# Patient Record
Sex: Female | Born: 1955 | Race: White | Hispanic: No | Smoking: Never smoker
Health system: Southern US, Community
[De-identification: ages and names within clinical notes are randomized; demographics above are authoritative.]

## PROBLEM LIST (undated history)

## (undated) DIAGNOSIS — R739 Hyperglycemia, unspecified: Secondary | ICD-10-CM

## (undated) DIAGNOSIS — M8580 Other specified disorders of bone density and structure, unspecified site: Secondary | ICD-10-CM

## (undated) DIAGNOSIS — E119 Type 2 diabetes mellitus without complications: Secondary | ICD-10-CM

## (undated) DIAGNOSIS — D044 Carcinoma in situ of skin of scalp and neck: Secondary | ICD-10-CM

## (undated) DIAGNOSIS — F419 Anxiety disorder, unspecified: Secondary | ICD-10-CM

## (undated) DIAGNOSIS — C4491 Basal cell carcinoma of skin, unspecified: Secondary | ICD-10-CM

## (undated) HISTORY — DX: Other specified disorders of bone density and structure, unspecified site: M85.80

## (undated) HISTORY — DX: Basal cell carcinoma of skin, unspecified: C44.91

## (undated) HISTORY — DX: Anxiety disorder, unspecified: F41.9

## (undated) HISTORY — DX: Carcinoma in situ of skin of scalp and neck: D04.4

## (undated) HISTORY — PX: SKIN CANCER EXCISION: SHX779

## (undated) HISTORY — PX: POLYPECTOMY: SHX149

## (undated) HISTORY — PX: WISDOM TOOTH EXTRACTION: SHX21

## (undated) HISTORY — DX: Hyperglycemia, unspecified: R73.9

## (undated) HISTORY — PX: DILATION AND CURETTAGE OF UTERUS: SHX78

---
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
## 2008-08-07 HISTORY — PX: FOOT SURGERY: SHX648

## 2015-01-12 LAB — HM HEPATITIS C SCREENING LAB: HM Hepatitis Screen: NEGATIVE

## 2015-01-12 LAB — HM PAP SMEAR: HM Pap smear: NEGATIVE

## 2016-05-30 ENCOUNTER — Encounter: Payer: Self-pay | Admitting: Family Medicine

## 2016-05-30 LAB — HM PAP SMEAR: HM Pap smear: NEGATIVE

## 2018-12-17 ENCOUNTER — Telehealth: Payer: Self-pay

## 2018-12-17 NOTE — Telephone Encounter (Signed)
Copied from [place] [phone]. Topic: Appointment Scheduling - Scheduling Inquiry for Clinic >> Dec 16, 2018  4:19 PM Rayann Heman wrote: Reason for CRM: pt would like to schedule new pt appt with Dr Aundra Dubin. Please advise

## 2018-12-17 NOTE — Telephone Encounter (Signed)
I will see patient --- but please be sure that she knows if she establishes with me, then I am her PCP  If she wants to see Dr Aundra Dubin, that is fine -- she will just need to wait longer for an appointment  She can come into clinic for new patient appt to see me as long as she wears mask, passes COVID screen and sanitizes hands when she gets here. We can draw lab work on her also if she comes into office.

## 2018-12-18 NOTE — Telephone Encounter (Signed)
Called pt an scheduled a new pt physical with you and she states she is having some gall bladder issues, I did explain that she will be screened again for covid upon arrival and to wear a mask, I also asked her to sanitize her hands before going into the room, pt understood.  Her appointment is with you tomorrow at 8:20 pm. She will be fasting and needs labs also.  Nina,cma

## 2018-12-18 NOTE — Telephone Encounter (Signed)
Can we confirm her insurance? Patient scheduled tomorrow for new patient appt  Thanks!  LG

## 2018-12-19 ENCOUNTER — Other Ambulatory Visit: Payer: Self-pay

## 2018-12-19 ENCOUNTER — Encounter: Payer: Self-pay | Admitting: Family Medicine

## 2018-12-19 ENCOUNTER — Ambulatory Visit (INDEPENDENT_AMBULATORY_CARE_PROVIDER_SITE_OTHER): Payer: PRIVATE HEALTH INSURANCE | Admitting: Family Medicine

## 2018-12-19 VITALS — BP 112/78 | HR 71 | Temp 98.6°F | Resp 16 | Ht 63.0 in | Wt 142.2 lb

## 2018-12-19 DIAGNOSIS — R1011 Right upper quadrant pain: Secondary | ICD-10-CM

## 2018-12-19 LAB — CBC WITH DIFFERENTIAL/PLATELET
Basophils Absolute: 0 10*3/uL (ref 0.0–0.1)
Basophils Relative: 0.8 % (ref 0.0–3.0)
Eosinophils Absolute: 0.1 10*3/uL (ref 0.0–0.7)
Eosinophils Relative: 1.1 % (ref 0.0–5.0)
HCT: 40.6 % (ref 36.0–46.0)
Hemoglobin: 14.1 g/dL (ref 12.0–15.0)
Lymphocytes Relative: 33 % (ref 12.0–46.0)
Lymphs Abs: 1.7 10*3/uL (ref 0.7–4.0)
MCHC: 34.7 g/dL (ref 30.0–36.0)
MCV: 89.1 fl (ref 78.0–100.0)
Monocytes Absolute: 0.4 10*3/uL (ref 0.1–1.0)
Monocytes Relative: 7.2 % (ref 3.0–12.0)
Neutro Abs: 3 10*3/uL (ref 1.4–7.7)
Neutrophils Relative %: 57.9 % (ref 43.0–77.0)
Platelets: 265 10*3/uL (ref 150.0–400.0)
RBC: 4.55 Mil/uL (ref 3.87–5.11)
RDW: 12.6 % (ref 11.5–15.5)
WBC: 5.3 10*3/uL (ref 4.0–10.5)

## 2018-12-19 LAB — BASIC METABOLIC PANEL
BUN: 11 mg/dL (ref 6–23)
CO2: 31 mEq/L (ref 19–32)
Calcium: 9.3 mg/dL (ref 8.4–10.5)
Chloride: 103 mEq/L (ref 96–112)
Creatinine, Ser: 0.76 mg/dL (ref 0.40–1.20)
GFR: 76.86 mL/min (ref >60.00)
Glucose, Bld: 84 mg/dL (ref 70–99)
Potassium: 4.2 mEq/L (ref 3.5–5.1)
Sodium: 140 mEq/L (ref 135–145)

## 2018-12-19 LAB — HEPATIC FUNCTION PANEL
ALT: 18 U/L (ref 0–35)
AST: 23 U/L (ref 0–37)
Albumin: 4.4 g/dL (ref 3.5–5.2)
Alkaline Phosphatase: 52 U/L (ref 39–117)
Bilirubin, Direct: 0.1 mg/dL (ref 0.0–0.3)
Total Bilirubin: 0.8 mg/dL (ref 0.2–1.2)
Total Protein: 6.7 g/dL (ref 6.0–8.3)

## 2018-12-19 LAB — LIPASE: Lipase: 96 U/L — ABNORMAL HIGH (ref 11.0–59.0)

## 2018-12-19 LAB — AMYLASE: Amylase: 53 U/L (ref 27–131)

## 2018-12-19 NOTE — Progress Notes (Signed)
Subjective:    Patient ID: Dawn Matthews, female    DOB: 12/08/1955, 63 y.o.   MRN: [id number]  HPI   Patient presents to clinic to establish with PCP and also to discuss right upper quadrant abdominal pain that she suspects could possibly be her gallbladder.  Patient currently takes no medications and has been told she was prediabetic in the past, but worked on diet and lost a little bit of weight to keep her blood sugars under control.  States that she first had an episode of right upper quadrant abdominal pain about 2 weeks ago after drinking some sangria.  Patient states she hardly ever drinks, may have 1 or 2 glasses of wine a couple of times a year.  Wonders if the sangria was too sweet, but after drinking it she had severe right upper quadrant pain that caused her to vomit.  After vomiting and the passage of a few hours she was feeling much better and back to her normal self.  About a week later, she began to have right upper quadrant pain began after eating a cheese sandwich.  States the pain in the right upper quadrant seem to go right through her all the way to her back.  She almost went to the emergency room due to this episode of pain, but chose not to due to concerns over exposure to COVID-19.  Again after a few hours past the pain resolved on its own.  No fever or chills.  Other than the one episode after drinking a sangria, has not had any more vomiting.  No diarrhea.  No shortness of breath or wheezing.  No chest pain or palpitations.  No urinary symptoms.  Past Medical History:  Diagnosis Date  . Diabetes mellitus without complication ([place])    Borderline   Social History   Tobacco Use  . Smoking status: Never Smoker  . Smokeless tobacco: Never Used  Substance Use Topics  . Alcohol use: Yes    Comment: ocassionally   Past Surgical History:  Procedure Laterality Date  . FOOT SURGERY  2010   Family History  Problem Relation Age of Onset  . Diabetes Mother   .  Diabetes Father      Review of Systems  Constitutional: Negative for chills, fatigue and fever.  HENT: Negative for congestion, ear pain, sinus pain and sore throat.   Eyes: Negative.   Respiratory: Negative for cough, shortness of breath and wheezing.   Cardiovascular: Negative for chest pain, palpitations and leg swelling.  Gastrointestinal: RUQ ABD pain episodes off and on. Negative for diarrhea, nausea and vomiting.  Genitourinary: Negative for dysuria, frequency and urgency.  Musculoskeletal: Negative for arthralgias and myalgias.  Skin: Negative for color change, pallor and rash.  Neurological: Negative for syncope, light-headedness and headaches.  Psychiatric/Behavioral: The patient is not nervous/anxious.    Objective:   Physical Exam Vitals signs and nursing note reviewed.  Constitutional:      General: She is not in acute distress.    Appearance: She is normal weight. She is not toxic-appearing.  HENT:     Head: Normocephalic and atraumatic.  Eyes:     General: No scleral icterus.    Extraocular Movements: Extraocular movements intact.     Conjunctiva/sclera: Conjunctivae normal.  Neck:     Musculoskeletal: Normal range of motion and neck supple. No neck rigidity.  Cardiovascular:     Rate and Rhythm: Normal rate.     Heart sounds: Normal heart sounds.  Pulmonary:  Effort: Pulmonary effort is normal. No respiratory distress.     Breath sounds: Normal breath sounds.  Abdominal:     General: Abdomen is flat. There is no distension.     Palpations: Abdomen is soft. There is no mass.     Tenderness: There is abdominal tenderness (RUQ tender with palpation). There is no right CVA tenderness, left CVA tenderness, guarding or rebound.     Hernia: No hernia is present.  Musculoskeletal:     Right lower leg: No edema.     Left lower leg: No edema.  Skin:    General: Skin is warm and dry.     Coloration: Skin is not jaundiced or pale.     Findings: No erythema.   Neurological:     Mental Status: She is alert and oriented to person, place, and time.     Gait: Gait normal.  Psychiatric:        Mood and Affect: Mood normal.        Behavior: Behavior normal.    Vitals:   12/19/18 0839  BP: 112/78  Pulse: 71  Resp: 16  Temp: 98.6 F (37 C)  SpO2: 96%      Assessment & Plan:   Right upper quadrant pain- due to location of pain could be suspicious for gallbladder disease.  We will do blood work to further investigate.  We will also get abdominal ultrasound.  Advised to avoid foods that are excessively greasy, fatty and spicy to see if it makes any difference in pain episodes.  Advised she will be contacted in regards to her lab results and information about ultrasound.  We will plan to have patient return to clinic for complete physical exam after work-up for abdominal pain issues.  She is aware she can call office anytime for issues or concerns.

## 2018-12-26 ENCOUNTER — Ambulatory Visit
Admission: RE | Admit: 2018-12-26 | Discharge: 2018-12-26 | Disposition: A | Discharge status: Home / Self Care | Payer: PRIVATE HEALTH INSURANCE | Source: Ambulatory Visit | Attending: Family Medicine | Admitting: Family Medicine

## 2018-12-26 ENCOUNTER — Other Ambulatory Visit: Payer: Self-pay

## 2018-12-26 DIAGNOSIS — R1011 Right upper quadrant pain: Secondary | ICD-10-CM | POA: Diagnosis not present

## 2019-05-21 ENCOUNTER — Encounter: Payer: PRIVATE HEALTH INSURANCE | Admitting: Family Medicine

## 2019-12-10 ENCOUNTER — Telehealth: Payer: Self-pay

## 2019-12-10 ENCOUNTER — Other Ambulatory Visit: Payer: Self-pay

## 2019-12-10 ENCOUNTER — Ambulatory Visit: Payer: 59 | Admitting: Family Medicine

## 2019-12-10 ENCOUNTER — Encounter: Payer: Self-pay | Admitting: Family Medicine

## 2019-12-10 VITALS — BP 124/78 | HR 66 | Temp 97.1°F | Resp 16 | Ht 63.0 in | Wt 142.5 lb

## 2019-12-10 DIAGNOSIS — R7303 Prediabetes: Secondary | ICD-10-CM | POA: Diagnosis not present

## 2019-12-10 DIAGNOSIS — Z1159 Encounter for screening for other viral diseases: Secondary | ICD-10-CM

## 2019-12-10 DIAGNOSIS — M8580 Other specified disorders of bone density and structure, unspecified site: Secondary | ICD-10-CM | POA: Diagnosis not present

## 2019-12-10 DIAGNOSIS — Z114 Encounter for screening for human immunodeficiency virus [HIV]: Secondary | ICD-10-CM

## 2019-12-10 DIAGNOSIS — R748 Abnormal levels of other serum enzymes: Secondary | ICD-10-CM | POA: Diagnosis not present

## 2019-12-10 DIAGNOSIS — R5382 Chronic fatigue, unspecified: Secondary | ICD-10-CM | POA: Diagnosis not present

## 2019-12-10 DIAGNOSIS — Z1211 Encounter for screening for malignant neoplasm of colon: Secondary | ICD-10-CM

## 2019-12-10 LAB — CBC
HCT: 40.4 % (ref 36.0–46.0)
Hemoglobin: 13.7 g/dL (ref 12.0–15.0)
MCHC: 34 g/dL (ref 30.0–36.0)
MCV: 89.4 fl (ref 78.0–100.0)
Platelets: 262 10*3/uL (ref 150.0–400.0)
RBC: 4.52 Mil/uL (ref 3.87–5.11)
RDW: 12.7 % (ref 11.5–15.5)
WBC: 5.3 10*3/uL (ref 4.0–10.5)

## 2019-12-10 LAB — LIPID PANEL
Cholesterol: 172 mg/dL (ref 0–200)
HDL: 65.9 mg/dL (ref >39.00)
LDL Cholesterol: 95 mg/dL (ref 0–99)
NonHDL: 105.87
Total CHOL/HDL Ratio: 3
Triglycerides: 54 mg/dL (ref 0.0–149.0)
VLDL: 10.8 mg/dL (ref 0.0–40.0)

## 2019-12-10 LAB — COMPREHENSIVE METABOLIC PANEL
ALT: 24 U/L (ref 0–35)
AST: 30 U/L (ref 0–37)
Albumin: 4.5 g/dL (ref 3.5–5.2)
Alkaline Phosphatase: 55 U/L (ref 39–117)
BUN: 12 mg/dL (ref 6–23)
CO2: 32 mEq/L (ref 19–32)
Calcium: 9.6 mg/dL (ref 8.4–10.5)
Chloride: 101 mEq/L (ref 96–112)
Creatinine, Ser: 0.78 mg/dL (ref 0.40–1.20)
GFR: 74.36 mL/min (ref >60.00)
Glucose, Bld: 94 mg/dL (ref 70–99)
Potassium: 4.5 mEq/L (ref 3.5–5.1)
Sodium: 138 mEq/L (ref 135–145)
Total Bilirubin: 0.7 mg/dL (ref 0.2–1.2)
Total Protein: 6.8 g/dL (ref 6.0–8.3)

## 2019-12-10 LAB — HEMOGLOBIN A1C: Hgb A1c MFr Bld: 5.7 % (ref 4.6–6.5)

## 2019-12-10 LAB — FERRITIN: Ferritin: 89.4 ng/mL (ref 10.0–291.0)

## 2019-12-10 LAB — VITAMIN D 25 HYDROXY (VIT D DEFICIENCY, FRACTURES): VITD: 52.33 ng/mL (ref 30.00–100.00)

## 2019-12-10 LAB — TSH: TSH: 1.39 u[IU]/mL (ref 0.35–4.50)

## 2019-12-10 LAB — LIPASE: Lipase: 24 U/L (ref 11.0–59.0)

## 2019-12-10 NOTE — Telephone Encounter (Signed)
Noted. Thank you. Will request these records along with others we discussed during office visit today

## 2019-12-10 NOTE — Telephone Encounter (Signed)
Patient contacted the office and states she has the information on her previous PCP. Her name was Dawn Mccoy, MD. The address is [place], [place], [place]. And telephone number is [phone]

## 2019-12-10 NOTE — Assessment & Plan Note (Signed)
Pt notes long hx of insomnia which is improving with better sleep hygiene. Symptoms also improved with activity. Etiology not entirely clear - will get some blood work - given lightheadedness wonder about iron/anemia. Also notes some SOB, however, normal lung exam and blood pressure today and no hx of wheezing. If blood work normal and symptoms persisting may consider cardiology/pulm referral for further work-up.

## 2019-12-10 NOTE — Assessment & Plan Note (Signed)
Notes hx and several family members. Last glucose 84 which is reassuring. Blood work today

## 2019-12-10 NOTE — Assessment & Plan Note (Signed)
Taking Vit D. Information provided for locations for repeat Dexa. Vit D level and Ca today

## 2019-12-10 NOTE — Progress Notes (Signed)
Subjective:     Dawn Matthews is a 64 y.o. female presenting for Transfer of Care (from [employer]) and Fatigue (for a long time)     HPI  #Fatigue - has been feeling a little better the last few weeks - has been eating better and taking Vit D and centrum silver - gets weak and dizzy occasionally - did have a dizzy spell which was positional the other day - will occasionally get symptoms w/o bending - dizziness is rare - sister has noted that she looks pale - every other day will feel tired - not do anything - but when she starts doing things she feels better with activity  Endorses some breathing difficulty - feeling out of breath - which is not worse with walking - would probably notice if running - may happen with going up and down the stairs  Endorses some anxiety symptoms  Insomnia - for several years - falls asleep and then wakes up at 2 am and cannot fall back asleep - for years has been drinking coffee at 3 am and reading the paper before falling back asleep - goes to bed around 10 pm and falls asleep fine - waking up 2-3 am - will drink coffee and be awake for 1-2 hours - will go back to sleep until 7 am - now she is reading a book until sleepy and time awake is shorter   Review of Systems  Constitutional: Negative for chills and fever.  HENT: Negative for rhinorrhea.   Eyes: Negative for itching.  Respiratory: Positive for chest tightness (was regular, but more rare now) and shortness of breath. Negative for wheezing.   Cardiovascular: Positive for palpitations. Negative for leg swelling.  Gastrointestinal: Negative for constipation, diarrhea, nausea and vomiting.  Musculoskeletal: Negative for arthralgias and myalgias.       Knees/ankles achy  Neurological: Positive for dizziness and light-headedness. Negative for headaches.     Social History   Tobacco Use  Smoking Status Never Smoker  Smokeless Tobacco Never Used        Objective:    BP  Readings from Last 3 Encounters:  12/10/19 124/78  12/19/18 112/78   Wt Readings from Last 3 Encounters:  12/10/19 142 lb 8 oz (64.6 kg)  12/19/18 142 lb 3.2 oz (64.5 kg)    BP 124/78   Pulse 66   Temp (!) 97.1 F (36.2 C)   Resp 16   Ht 5\' 3"  (1.6 m)   Wt 142 lb 8 oz (64.6 kg)   BMI 25.24 kg/m    Physical Exam Constitutional:      General: She is not in acute distress.    Appearance: She is well-developed. She is not diaphoretic.  HENT:     Right Ear: External ear normal.     Left Ear: External ear normal.     Nose: Nose normal.  Eyes:     Conjunctiva/sclera: Conjunctivae normal.  Neck:     Thyroid: No thyroid mass, thyromegaly or thyroid tenderness.  Cardiovascular:     Rate and Rhythm: Normal rate and regular rhythm.     Heart sounds: No murmur.  Pulmonary:     Effort: Pulmonary effort is normal. No respiratory distress.     Breath sounds: Normal breath sounds. No wheezing.  Musculoskeletal:     Cervical back: Neck supple.  Skin:    General: Skin is warm and dry.     Capillary Refill: Capillary refill takes less than 2 seconds.  Neurological:     Mental Status: She is alert. Mental status is at baseline.  Psychiatric:        Mood and Affect: Mood normal.        Behavior: Behavior normal.           Assessment & Plan:   Problem List Items Addressed This Visit      Musculoskeletal and Integument   Osteopenia    Taking Vit D. Information provided for locations for repeat Dexa. Vit D level and Ca today      Relevant Orders   Comprehensive metabolic panel     Other   Prediabetes    Notes hx and several family members. Last glucose 84 which is reassuring. Blood work today      Relevant Orders   Hemoglobin A1c   Chronic fatigue - Primary    Pt notes long hx of insomnia which is improving with better sleep hygiene. Symptoms also improved with activity. Etiology not entirely clear - will get some blood work - given lightheadedness wonder about  iron/anemia. Also notes some SOB, however, normal lung exam and blood pressure today and no hx of wheezing. If blood work normal and symptoms persisting may consider cardiology/pulm referral for further work-up.       Relevant Orders   Lipid panel   Ferritin   Vitamin D, 25-hydroxy   TSH   CBC    Other Visit Diagnoses    Elevated lipase       Relevant Orders   Lipase   Encounter for hepatitis C screening test for low risk patient       Relevant Orders   Hepatitis C antibody   Screening for HIV (human immunodeficiency virus)       Relevant Orders   HIV Antibody (routine testing w rflx)   Colon cancer screening       Relevant Orders   Fecal occult blood, imunochemical       Return in about 6 months (around 06/11/2020).  Lesleigh Noe, MD

## 2019-12-10 NOTE — Patient Instructions (Signed)
Please call the location of your choice from the menu below to schedule your Mammogram and/or Bone Density appointment.   ° °[place]  ° °1. [facility]                °      Phone:  [phone] °[address]                               °[place], [place] [address]                                                             °Services: Traditional and 3D Mammogram, Bone Density  ° °2. [facility]           °      Phone: [phone] °[address]                                                       °[place], [place] [address]    °Service: Bone Density ONLY  ° *this site does NOT perform mammograms ° °3. [facility]                       ° Phone:  [phone] °[address]                                  °[place], [place] [address]                                            °Services:  3D Mammogram and Bone Density  ° ° °[place] ° °1. [facility]   °Phone:  [phone]   °[address]                                                                            °[place], [place] [address]                                            °Services: 3D Mammogram and Bone Density ° °2. [facility] [place] ([facility])  °Phone:  [phone]   °[address]                        °[place], [place] [address]                                              °  Services:  3D Mammogram and Bone Density

## 2019-12-11 LAB — HIV ANTIBODY (ROUTINE TESTING W REFLEX): HIV 1&2 Ab, 4th Generation: NONREACTIVE

## 2019-12-11 LAB — HEPATITIS C ANTIBODY
Hepatitis C Ab: NONREACTIVE
SIGNAL TO CUT-OFF: 0.01 (ref <1.00)

## 2019-12-15 ENCOUNTER — Other Ambulatory Visit (INDEPENDENT_AMBULATORY_CARE_PROVIDER_SITE_OTHER): Payer: 59

## 2019-12-15 DIAGNOSIS — Z1211 Encounter for screening for malignant neoplasm of colon: Secondary | ICD-10-CM

## 2019-12-15 LAB — FECAL OCCULT BLOOD, IMMUNOCHEMICAL: Fecal Occult Bld: NEGATIVE

## 2020-01-28 ENCOUNTER — Ambulatory Visit: Payer: PRIVATE HEALTH INSURANCE | Admitting: Dermatology

## 2020-05-24 ENCOUNTER — Other Ambulatory Visit: Payer: Self-pay | Admitting: Family Medicine

## 2020-05-24 DIAGNOSIS — Z1231 Encounter for screening mammogram for malignant neoplasm of breast: Secondary | ICD-10-CM

## 2020-06-14 ENCOUNTER — Ambulatory Visit (INDEPENDENT_AMBULATORY_CARE_PROVIDER_SITE_OTHER): Payer: 59 | Admitting: Family Medicine

## 2020-06-14 ENCOUNTER — Other Ambulatory Visit: Payer: Self-pay

## 2020-06-14 VITALS — BP 110/70 | HR 67 | Temp 98.2°F | Ht 63.0 in | Wt 143.8 lb

## 2020-06-14 DIAGNOSIS — Z0000 Encounter for general adult medical examination without abnormal findings: Secondary | ICD-10-CM

## 2020-06-14 DIAGNOSIS — C4491 Basal cell carcinoma of skin, unspecified: Secondary | ICD-10-CM | POA: Insufficient documentation

## 2020-06-14 DIAGNOSIS — Z23 Encounter for immunization: Secondary | ICD-10-CM | POA: Diagnosis not present

## 2020-06-14 DIAGNOSIS — C44319 Basal cell carcinoma of skin of other parts of face: Secondary | ICD-10-CM

## 2020-06-14 NOTE — Patient Instructions (Addendum)
Check with insurance about shingles shot -- and schedule nurse visit or go to pharmacy to get this done   Preventive Care 51-64 Years Old, Female Preventive care refers to visits with your health care provider and lifestyle choices that can promote health and wellness. This includes:  A yearly physical exam. This may also be called an annual well check.  Regular dental visits and eye exams.  Immunizations.  Screening for certain conditions.  Healthy lifestyle choices, such as eating a healthy diet, getting regular exercise, not using drugs or products that contain nicotine and tobacco, and limiting alcohol use. What can I expect for my preventive care visit? Physical exam Your health care provider will check your:  Height and weight. This may be used to calculate body mass index (BMI), which tells if you are at a healthy weight.  Heart rate and blood pressure.  Skin for abnormal spots. Counseling Your health care provider may ask you questions about your:  Alcohol, tobacco, and drug use.  Emotional well-being.  Home and relationship well-being.  Sexual activity.  Eating habits.  Work and work Statistician.  Method of birth control.  Menstrual cycle.  Pregnancy history. What immunizations do I need?  Influenza (flu) vaccine  This is recommended every year. Tetanus, diphtheria, and pertussis (Tdap) vaccine  You may need a Td booster every 10 years. Varicella (chickenpox) vaccine  You may need this if you have not been vaccinated. Zoster (shingles) vaccine  You may need this after age 67. Measles, mumps, and rubella (MMR) vaccine  You may need at least one dose of MMR if you were born in 1957 or later. You may also need a second dose. Pneumococcal conjugate (PCV13) vaccine  You may need this if you have certain conditions and were not previously vaccinated. Pneumococcal polysaccharide (PPSV23) vaccine  You may need one or two doses if you smoke cigarettes  or if you have certain conditions. Meningococcal conjugate (MenACWY) vaccine  You may need this if you have certain conditions. Hepatitis A vaccine  You may need this if you have certain conditions or if you travel or work in places where you may be exposed to hepatitis A. Hepatitis B vaccine  You may need this if you have certain conditions or if you travel or work in places where you may be exposed to hepatitis B. Haemophilus influenzae type b (Hib) vaccine  You may need this if you have certain conditions. Human papillomavirus (HPV) vaccine  If recommended by your health care provider, you may need three doses over 6 months. You may receive vaccines as individual doses or as more than one vaccine together in one shot (combination vaccines). Talk with your health care provider about the risks and benefits of combination vaccines. What tests do I need? Blood tests  Lipid and cholesterol levels. These may be checked every 5 years, or more frequently if you are over 60 years old.  Hepatitis C test.  Hepatitis B test. Screening  Lung cancer screening. You may have this screening every year starting at age 29 if you have a 30-pack-year history of smoking and currently smoke or have quit within the past 15 years.  Colorectal cancer screening. All adults should have this screening starting at age 34 and continuing until age 80. Your health care provider may recommend screening at age 39 if you are at increased risk. You will have tests every 1-10 years, depending on your results and the type of screening test.  Diabetes screening. This  is done by checking your blood sugar (glucose) after you have not eaten for a while (fasting). You may have this done every 1-3 years.  Mammogram. This may be done every 1-2 years. Talk with your health care provider about when you should start having regular mammograms. This may depend on whether you have a family history of breast cancer.  BRCA-related  cancer screening. This may be done if you have a family history of breast, ovarian, tubal, or peritoneal cancers.  Pelvic exam and Pap test. This may be done every 3 years starting at age 48. Starting at age 89, this may be done every 5 years if you have a Pap test in combination with an HPV test. Other tests  Sexually transmitted disease (STD) testing.  Bone density scan. This is done to screen for osteoporosis. You may have this scan if you are at high risk for osteoporosis. Follow these instructions at home: Eating and drinking  Eat a diet that includes fresh fruits and vegetables, whole grains, lean protein, and low-fat dairy.  Take vitamin and mineral supplements as recommended by your health care provider.  Do not drink alcohol if: ? Your health care provider tells you not to drink. ? You are pregnant, may be pregnant, or are planning to become pregnant.  If you drink alcohol: ? Limit how much you have to 0-1 drink a day. ? Be aware of how much alcohol is in your drink. In the [place] one drink equals one 12 oz bottle of beer (355 mL), one 5 oz glass of wine (148 mL), or one 1 oz glass of hard liquor (44 mL). Lifestyle  Take daily care of your teeth and gums.  Stay active. Exercise for at least 30 minutes on 5 or more days each week.  Do not use any products that contain nicotine or tobacco, such as cigarettes, e-cigarettes, and chewing tobacco. If you need help quitting, ask your health care provider.  If you are sexually active, practice safe sex. Use a condom or other form of birth control (contraception) in order to prevent pregnancy and STIs (sexually transmitted infections).  If told by your health care provider, take low-dose aspirin daily starting at age 51. What's next?  Visit your health care provider once a year for a well check visit.  Ask your health care provider how often you should have your eyes and teeth checked.  Stay up to date on all vaccines. This  information is not intended to replace advice given to you by your health care provider. Make sure you discuss any questions you have with your health care provider. Document Revised: 04/04/2018 Document Reviewed: 04/04/2018 Elsevier Patient Education  2020 [employer].

## 2020-06-14 NOTE — Progress Notes (Addendum)
Annual Exam   Chief Complaint:  Chief Complaint  Patient presents with  . Annual Exam    no major concerns     History of Present Illness:  Ms. Dawn Matthews is a 64 y.o. No obstetric history on file. who LMP was No LMP recorded. Patient is postmenopausal., presents today for her annual examination.     Nutrition She does get adequate calcium and Vitamin D in her diet. Diet: varies - tries to be healthy, but eats sweets Exercise: walking, not as active recently  Safety The patient wears seatbelts: yes.     The patient feels safe at home and in their relationships: yes.   Menstrual:  No issues  GYN She is not sexually active.    Cervical Cancer Screening (21-65):   Last Pap:   October 2017 Results were: no abnormalities HPV DNA - not done  Breast Cancer Screening (Age 49-74):  There is no FH of breast cancer. There is no FH of ovarian cancer. BRCA screening Not Indicated.  Last Mammogram: 2018 The patient does want a mammogram this year.    Colon Cancer Screening:  Age 58-75 yo - benefits outweigh the risk. Adults 65-85 yo who have never been screened benefit.  Benefits: 134000 people in 2016 will be diagnosed and 49,000 will die - early detection helps Harms: Complications 2/2 to colonoscopy High Risk (Colonoscopy): genetic disorder (Lynch syndrome or familial adenomatous polyposis), personal hx of IBD, previous adenomatous polyp, or previous colorectal cancer, FamHx start 10 years before the age at diagnosis, increased in males and black race  Options:  FIT - looks for hemoglobin (blood in the stool) - specific and fairly sensitive - must be done annually Cologuard - looks for DNA and blood - more sensitive - therefore can have more false positives, every 3 years Colonoscopy - every 10 years if normal - sedation, bowl prep, must have someone drive you  Shared decision making and the patient had decided to do FOBT and UTD.   Social History   Tobacco Use   Smoking Status Never Smoker  Smokeless Tobacco Never Used    Lung Cancer Screening (Ages 24-58): not applicable    Weight Wt Readings from Last 3 Encounters:  06/14/20 143 lb 12 oz (65.2 kg)  12/10/19 142 lb 8 oz (64.6 kg)  12/19/18 142 lb 3.2 oz (64.5 kg)   Patient has normal BMI  BMI Readings from Last 1 Encounters:  06/14/20 25.46 kg/m     Chronic disease screening Blood pressure monitoring:  BP Readings from Last 3 Encounters:  06/14/20 110/70  12/10/19 124/78  12/19/18 112/78    Lipid Monitoring: Indication for screening: age >30, obesity, diabetes, family hx, CV risk factors.  Lipid screening: Yes  Lab Results  Component Value Date   CHOL 172 12/10/2019   HDL 65.90 12/10/2019   LDLCALC 95 12/10/2019   TRIG 54.0 12/10/2019   CHOLHDL 3 12/10/2019     Diabetes Screening: age >13, overweight, family hx, PCOS, hx of gestational diabetes, at risk ethnicity Diabetes Screening screening: Yes  Lab Results  Component Value Date   HGBA1C 5.7 12/10/2019     Past Medical History:  Diagnosis Date  . Basal cell carcinoma   . Hyperglycemia   . Osteopenia   . 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 FOOT SURGERY  2010  . SKIN CANCER EXCISION      Prior to Admission  medications   Medication Sig Start Date End Date Taking? Authorizing Provider  Multiple Vitamins-Minerals (CENTRUM SILVER PO) Take by mouth.   Yes [provider]  VITAMIN D, CHOLECALCIFEROL, PO Take by mouth.   Yes [provider]    Allergies  Allergen Reactions  . Codeine     Gynecologic History: No LMP recorded. Patient is postmenopausal.  Obstetric History: No obstetric history on file.  Social History   Socioeconomic History  . Marital status: Single    Spouse name: Not on file  . Number of children: 0  . Years of education: College  . Highest education level: Not on file  Occupational  History  . Not on file  Tobacco Use  . Smoking status: Never Smoker  . Smokeless tobacco: Never Used  Vaping Use  . Vaping Use: Never used  Substance and Sexual Activity  . Alcohol use: Yes    Comment: ocassionally-wine  . Drug use: Never  . Sexual activity: Not Currently  Other Topics Concern  . Not on file  Social History Narrative   12/10/19   From: [place] - moved to [facility] in 2019 to be near family   Living: alone    Pet: dog - german shepherd   Work: retired from [employer] and [employer]      Family: sister nearby, but does have family in [place] still      Enjoys: walking, visiting small towns, crossword puzzles, drink coffee      Exercise: walking 3-4 miles per day   Diet: likes ice cream eater, trying to eat healthier now, usually skips lunch and eats early dinner      Safety   Seat belts: Yes    Guns: No   Safe in relationships: Yes    Social Determinants of Radio broadcast assistant Strain:   . Difficulty of Paying Living Expenses: Not on file  Food Insecurity:   . Worried About Charity fundraiser in the Last Year: Not on file  . Ran Out of Food in the Last Year: Not on file  Transportation Needs:   . Lack of Transportation (Medical): Not on file  . Lack of Transportation (Non-Medical): Not on file  Physical Activity:   . Days of Exercise per Week: Not on file  . Minutes of Exercise per Session: Not on file  Stress:   . Feeling of Stress : Not on file  Social Connections:   . Frequency of Communication with Friends and Family: Not on file  . Frequency of Social Gatherings with Friends and Family: Not on file  . Attends Religious Services: Not on file  . Active Member of Clubs or Organizations: Not on file  . Attends Archivist Meetings: Not on file  . Marital Status: Not on file  Intimate Partner Violence:   . Fear of Current or Ex-Partner: Not on file  . Emotionally Abused: Not on file  . Physically Abused: Not on file  .  Sexually Abused: Not on file    Family History  Problem Relation Age of Onset  . Diabetes Mother   . COPD Mother   . Hypertension Mother   . Atrial fibrillation Mother   . Diabetes Father   . Melanoma Sister   . Hypertension Sister   . Melanoma Brother   . Lung cancer Sister        smoker  . Diabetes Maternal Grandmother   . Breast cancer Neg Hx  Review of Systems  Constitutional: Negative for chills and fever.  HENT: Negative for congestion and sore throat.   Eyes: Negative for blurred vision and double vision.  Respiratory: Negative for shortness of breath.   Cardiovascular: Negative for chest pain.  Gastrointestinal: Negative for heartburn, nausea and vomiting.  Genitourinary: Negative.   Musculoskeletal: Negative.  Negative for myalgias.  Skin: Negative for rash.  Neurological: Negative for dizziness and headaches.  Endo/Heme/Allergies: Does not bruise/bleed easily.  Psychiatric/Behavioral: Negative for depression. The patient is not nervous/anxious.      Physical Exam BP 110/70   Pulse 67   Temp 98.2 F (36.8 C) (Temporal)   Ht '5\' 3"'  (1.6 m)   Wt 143 lb 12 oz (65.2 kg)   SpO2 96%   BMI 25.46 kg/m    BP Readings from Last 3 Encounters:  06/14/20 110/70  12/10/19 124/78  12/19/18 112/78      Physical Exam Exam conducted with a chaperone present.  Constitutional:      General: She is not in acute distress.    Appearance: She is well-developed. She is not diaphoretic.  HENT:     Head: Normocephalic and atraumatic.     Right Ear: External ear normal.     Left Ear: External ear normal.     Nose: Nose normal.  Eyes:     General: No scleral icterus.    Conjunctiva/sclera: Conjunctivae normal.  Cardiovascular:     Rate and Rhythm: Normal rate and regular rhythm.     Heart sounds: No murmur heard.   Pulmonary:     Effort: Pulmonary effort is normal. No respiratory distress.     Breath sounds: Normal breath sounds. No wheezing.  Abdominal:      General: Bowel sounds are normal. There is no distension.     Palpations: Abdomen is soft. There is no mass.     Tenderness: There is no abdominal tenderness. There is no guarding or rebound.  Genitourinary:    Comments: Pt with discomfort with speculum exam and unable to find cervix on first attempt. Pt elected to postpone to a future visit.  Musculoskeletal:        General: Normal range of motion.     Cervical back: Neck supple.  Lymphadenopathy:     Cervical: No cervical adenopathy.  Skin:    General: Skin is warm and dry.     Capillary Refill: Capillary refill takes less than 2 seconds.  Neurological:     Mental Status: She is alert and oriented to person, place, and time.     Deep Tendon Reflexes: Reflexes normal.  Psychiatric:        Behavior: Behavior normal.     Results:  PHQ-9:    Office Visit from 06/14/2020 in [place] [facility] [place]  PHQ-9 Total Score 13        Office Visit from 06/14/2020 in [place] [facility]  PHQ-9 Total Score 13        Assessment: 64 y.o. No obstetric history on file. female here for routine annual physical examination.  Plan: Problem List Items Addressed This Visit      Musculoskeletal and Integument   Basal cell carcinoma    Other Visit Diagnoses    Annual physical exam    -  Primary   Need for Tdap vaccination       Relevant Orders   Tdap vaccine greater than or equal to 7yo IM (Completed)      Screening: -- Blood  pressure screen normal -- cholesterol screening: not due for screening -- Weight screening: normal -- Diabetes Screening: not due for screening -- Nutrition: Encouraged healthy diet  The 10-year ASCVD risk score Mikey Bussing DC Jr., et al., 2013) is: 3.2%   Values used to calculate the score:     Age: 89 years     Sex: Female     Is Non-Hispanic African American: No     Diabetic: No     Tobacco smoker: No     Systolic Blood Pressure: 022 mmHg     Is BP treated: No     HDL Cholesterol:  65.9 mg/dL     Total Cholesterol: 172 mg/dL  -- Statin therapy for Age 25-75 with CVD risk >7.5%  Psych -- Depression screening (PHQ-9):    Office Visit from 06/14/2020 in [place] at [place]  PHQ-9 Total Score 13    increased - discussed and she feels this circumstantial and she is managing well. She will f/u if worsening   Safety -- tobacco screening: not using -- alcohol screening:  low-risk usage. -- no evidence of domestic violence or intimate partner violence.   Cancer Screening -- pap smear: Deferred due to difficulty with exam.  -- family history of breast cancer screening: done. not at high risk. -- Mammogram - ordered -- Colon cancer (age 69+)-- up to date  Immunizations Immunization History  Administered Date(s) Administered  . PFIZER SARS-COV-2 Vaccination 10/16/2019, 11/06/2019  . Tdap 06/14/2020    -- flu vaccine up to date -- TDAP q10 years given today -- Shingles (age >52) she will check with insurance -- Covid-19 Vaccine up to date   Encouraged healthy diet and exercise. Encouraged regular vision and dental care.    Lesleigh Noe, MD

## 2020-06-24 ENCOUNTER — Ambulatory Visit
Admission: RE | Admit: 2020-06-24 | Discharge: 2020-06-24 | Disposition: A | Discharge status: Home / Self Care | Payer: 59 | Source: Ambulatory Visit | Attending: Family Medicine | Admitting: Family Medicine

## 2020-06-24 ENCOUNTER — Other Ambulatory Visit: Payer: Self-pay

## 2020-06-24 DIAGNOSIS — Z1231 Encounter for screening mammogram for malignant neoplasm of breast: Secondary | ICD-10-CM | POA: Diagnosis not present

## 2020-07-09 ENCOUNTER — Telehealth: Payer: Self-pay

## 2020-07-09 NOTE — Telephone Encounter (Signed)
Patient calls in to report concerns for her OV with Dr. Einar Pheasant on 06/14/20.  She was calm and pleasant but felt she needed to make [place] aware of the following:  1.  She repeatedly stated and scheduled this appointment to be a physical and she should not have been billed for a copay.  Patient states she was asked at the window to pay a copay because it was scheduled as a follow up.  She did not pay and the check in person said they could just bill her if Dr. Einar Pheasant does not bill it as a CPE.  Dr. Einar Pheasant told patient during visit that she could do a CPE as long as patient did not have any other complaints, patient agreed.    Her concern with this is, the bill she has looks like she was billed for a "preventative exam" and charged a copay.  I told her I would investigate this but it I believe "preventative exam" is the level of service code used for physicals and Dr. Einar Pheasant does have documented as an annual exam.  She requests I let her know what I find out.   2.  Patient said she was told by Dr. Einar Pheasant that the Tetanus would be "covered under the physical" but it looks like she was being billed for the cost of the Tetanus as well.  I will investigate and follow up with her on this charge.   3.  Patient said she wanted an EKG but Dr. Einar Pheasant stated that she did not typically do EKG's at physicals.   4. Patient wanted documented that Dr. Einar Pheasant could not find her cervix and that the pap was painful ( which I did confirm that Dr. Einar Pheasant had documented in her OV note).  Patient goes on to report that , " This was the worst experience of my life and I have been having paps for nearly 50 years".  She reported that it was extremely painful and she had never had someone unable to find her cervix.  She states that she will not be coming back to see Dr. Einar Pheasant.   I apologized to patient that she had a negative experience and let her know that I will review this with the provider and with billing and call her back with follow up.   Patient thanked me and stated that overall the office was extremely nice and friendly and no other complaints.  I told her it would likely be later next week before I could follow up and she verbalized understanding.

## 2020-07-12 NOTE — Telephone Encounter (Signed)
Reviewed patient complaint.  1. I have updated the patient encounter to include the diagnosis code "Annual Physical exam." It was left off for the initial encounter - hopefully this will resolve the billing issue. The billing code is correct but it was lacking the appropriate diagnosis.   2. Agree with looking into the Tetanus charge.   3. I do not recall her requesting an EKG, but this is not a routine part of physicals that I do. I order them based on patient history and complaints.   4. It was a difficult exam and due to her discomfort we discussed stopping - I offered a GYN referral however, she stated she would return next year. If she would like to see GYN I can place that referral.   I am sorry for the billing issue that has occurred and for her negative experience.

## 2020-07-21 NOTE — Telephone Encounter (Signed)
Spoke with billing department.  We are refilling the claim with the correct dx code which should take care of the charges on her statement.   I LM on machine for patient to call me back to discuss further.

## 2020-07-22 NOTE — Telephone Encounter (Signed)
Patient called and I relayed message to her. Transferred call to Mandy's vm. EM

## 2020-09-13 NOTE — Telephone Encounter (Signed)
Per my office supervisor, when following up on claim, appears this has been corrected and paid.  Nothing further to do at this point.

## 2021-01-19 DIAGNOSIS — M79671 Pain in right foot: Secondary | ICD-10-CM | POA: Diagnosis not present

## 2021-01-19 DIAGNOSIS — M7671 Peroneal tendinitis, right leg: Secondary | ICD-10-CM | POA: Diagnosis not present

## 2021-06-27 ENCOUNTER — Telehealth: Payer: Self-pay

## 2021-06-27 NOTE — Telephone Encounter (Signed)
Spoke with patient myself and advised her that her symptoms are very concerning and I am unable to rule out an acute MI or other etiology.  I have advised her to go immediately to the emergency room and/or call 911 however patient declines multiple times.  I have informed her that this could be life-threatening.  Patient is aware and states that she still will not go to the emergency room.

## 2021-06-27 NOTE — Telephone Encounter (Signed)
Called patient she was super nice and understanding and appreciative that we are checking on her, [name] Np, took over call and will notate.

## 2021-06-27 NOTE — Telephone Encounter (Signed)
Per appt notes pt already has appt with Hoy Morn NP on 06/28/21 at 9:40.  I spoke with pt; pt said she just came in from a "little walk" and pt having tightness in chest. Pt has heaviness and tightness in entire chest that goes thru to pts back. This is not a sharpe or dull pain it is just uncomfortable feeling in chest and back. Last pain in rt side of chest and shoulder has been rated a pain level of 10 and the last shoulder type pain was 06/05/21. Pt said usually has SOB upon exertion but recently the SOB seems worse than usual. Pt having nausea on and off which is unusual for pt. Pt had cold symptoms for approx 10 days with runny nose but pt is not having runny nose any longer; no fever or body aches. Pt had a negative covid test since she has had cold symptoms but could not pin point the day that took test.Pt said over the past weekend pt was helping pick up sticks and limbs on [facility] walking trail for 5 hours. Pt wonders if pulled muscle doing that. Pt does not have a way to ck BP at this time. Pt said she does not want to go to UC or ED to be checked out for above symptoms today. Pt said she will keep appt already scheduled at [facility] office on 06/28/21 at 9 :22 with Tabitha Dugal NP.UC & ED precautions given and pt voiced understanding. Sending note to [name] NP and [facility] CMA. Will also teams [place] as well.

## 2021-06-27 NOTE — Telephone Encounter (Signed)
[facility] - Client TELEPHONE ADVICE RECORD [facility] Patient Name: Dawn Matthews Gender: Female DOB: [birth year] Age: 65 Y 61 M 12 D Return Phone Number: [phone] (Primary) Address: City/ State/ Zip: [place] [address] Client [facility] - Client Client Site [place] - Day Provider Waunita Schooner- MD Contact Type Call Who Is Calling Patient / Member / Family / Caregiver Call Type Triage / Clinical Relationship To Patient Self Return Phone Number [phone] (Primary) Chief Complaint CHEST PAIN - pain, pressure, heaviness or tightness Reason for Call Symptomatic / Request for [place] states she is having headaches, chest pain that radiates to her back and up to her shoulder. Translation No Nurse Assessment Nurse: Malena Peer, RN, Edwena Felty Date/Time (Eastern Time): 06/27/2021 1:07:47 PM Confirm and document reason for call. If symptomatic, describe symptoms. ---Caller states she has been having "heaviness" in her chest for the past few days; she states she has been doing a lot of lifting. Chest pain goes from under her breastbone and radiates around then up into her back. She states it isn't a real "pain" but she feels the sensation and this it may be from picking up trash in the community 2 days ago. She states if she had to rate the pain it would be maybe 1/10. She states she has also had a pain under her right breastbone that occurs when she reaches up with her right arm and states there is a piercing, lengthy pain that has been occurring for quite a while when she reaches "weird". She states she has to "rub the area out" to get it to stop hurting. She rates the pain at 10/10 the last time it occurred 06/05/21. Does the patient have any new or worsening symptoms? ---Yes Will a triage be completed? ---Yes Related visit to physician within the  last 2 weeks? ---No Does the PT have any chronic conditions? (i.e. diabetes, asthma, this includes High risk factors for pregnancy, etc.) ---No Is this a behavioral health or substance abuse call? ---No PLEASE NOTE: All timestamps contained within this report are represented as [place] Standard Time. CONFIDENTIALTY NOTICE: This fax transmission is intended only for the addressee. It contains information that is legally privileged, confidential or otherwise protected from use or disclosure. If you are not the intended recipient, you are strictly prohibited from reviewing, disclosing, copying using or disseminating any of this information or taking any action in reliance on or regarding this information. If you have received this fax in error, please notify us immediately by telephone so that we can arrange for its return to [place]. Phone: [phone], Toll-Free: [phone], Fax: [phone] Page: 2 of 3 Call Id: [id number] Guidelines Guideline Title Affirmed Question Affirmed Notes Nurse Date/Time Eilene Ghazi Time) Chest Injury - Bending Lifting or Twisting [1] High-risk adult (e.g., age > 51 years, osteoporosis, chronic steroid use) AND [2] still hurts Malena Peer, RN, Edwena Felty 06/27/2021 1:16:55 PM Disp. Time Eilene Ghazi Time) Disposition Final User 06/27/2021 1:05:32 PM Send to Urgent Queue Susette Racer 06/27/2021 1:23:00 PM See PCP within 24 Hours Yes Malena Peer, RN, Osie Cheeks Disagree/Comply Comply Caller Understands Yes PreDisposition Call Doctor Care Advice Given Per Guideline SEE PCP WITHIN 24 HOURS: * IF OFFICE WILL BE OPEN: You need to be examined within the next 24 hours. Call your doctor (or NP/PA) when the office opens and make an appointment. PAIN MEDICINES: * For pain relief, you can take either acetaminophen, ibuprofen, or naproxen. *  They are over-the-counter (OTC) pain drugs. You can buy them at the drugstore. * ACETAMINOPHEN - REGULAR STRENGTH TYLENOL: Take 650 mg (two  325 mg pills) by mouth every 4 to 6 hours as needed. Each Regular Strength Tylenol pill has 325 mg of acetaminophen. The most you should take is 10 pills a day (3,250 mg total). Note: In [place], the maximum is 12 pills a day (3,900 mg total). * ACETAMINOPHEN - EXTRA STRENGTH TYLENOL: Take 1,000 mg (two 500 mg pills) every 6 to 8 hours as needed. Each Extra Strength Tylenol pill has 500 mg of acetaminophen. The most you should take is 6 pills a day (3,000 mg total). Note: In [place], the maximum is 8 pills a day (4,000 mg total). * IBUPROFEN (E.G., MOTRIN, ADVIL): Take 400 mg (two 200 mg pills) by mouth every 6 hours. The most you should take is 6 pills a day (1,200 mg total). * NAPROXEN (E.G., ALEVE): Take 220 mg (one 220 mg pill) by mouth every 8 to 12 hours as needed. You may take 440 mg (two 220 mg pills) for your first dose. The most you should take is 3 pills a day (660 mg total). Note: In [place], the maximum is 2 pills a day (one every 12 hours; 440 mg total). * Use the lowest amount of medicine that makes your pain better. USE A COLD PACK FOR PAIN, SWELLING, OR BRUISING: * Put a cold pack or an ice bag (wrapped in a moist towel) on the area for 20 minutes. * Repeat in 1 hour, then every 4 hours while awake. * Continue this for the first 48 hours (2 days) after an injury. * This will help decrease pain, swelling, and bruising. * Caution: Avoid frostbite. USE HEAT ON AREA AFTER 48 HOURS: * If pain, swelling, or bruising last more than 48 hours (2 days), then use heat on the area. * Use a heat pack, heating pad, or warm wet washcloth. * Do this for 10 minutes three times a day. * This will help increase blood flow and improve healing. * Caution: Avoid burn. Do not sleep on a heating pad. * Severe pain persists over 2 hours after pain medicine and ice CALL BACK IF: * Difficulty breathing occurs * You become worse CARE ADVICE given per Chest Injury - Bending, Lifting, or Twisting  (Adult) guideline. Comments User: Rodney Cruise, RN Date/Time Eilene Ghazi Time): 06/27/2021 1:16:07 PM Caller denies having current chest pain or heaviness. She states she has always had shortness of breath but it is more so since getting over a cold recently

## 2021-06-28 ENCOUNTER — Ambulatory Visit (INDEPENDENT_AMBULATORY_CARE_PROVIDER_SITE_OTHER): Payer: Medicare Other | Admitting: Family

## 2021-06-28 ENCOUNTER — Ambulatory Visit
Admission: RE | Admit: 2021-06-28 | Discharge: 2021-06-28 | Disposition: A | Discharge status: Home / Self Care | Payer: Medicare Other | Source: Ambulatory Visit | Attending: Family | Admitting: Family

## 2021-06-28 ENCOUNTER — Encounter: Payer: Self-pay | Admitting: Family

## 2021-06-28 ENCOUNTER — Other Ambulatory Visit: Payer: Self-pay

## 2021-06-28 ENCOUNTER — Ambulatory Visit (INDEPENDENT_AMBULATORY_CARE_PROVIDER_SITE_OTHER): Payer: Medicare Other

## 2021-06-28 VITALS — BP 122/80 | HR 74 | Temp 97.5°F | Ht 63.0 in | Wt 143.0 lb

## 2021-06-28 DIAGNOSIS — R0602 Shortness of breath: Secondary | ICD-10-CM

## 2021-06-28 DIAGNOSIS — J449 Chronic obstructive pulmonary disease, unspecified: Secondary | ICD-10-CM | POA: Diagnosis not present

## 2021-06-28 DIAGNOSIS — R1011 Right upper quadrant pain: Secondary | ICD-10-CM | POA: Diagnosis not present

## 2021-06-28 DIAGNOSIS — R9431 Abnormal electrocardiogram [ECG] [EKG]: Secondary | ICD-10-CM | POA: Diagnosis not present

## 2021-06-28 DIAGNOSIS — Z1211 Encounter for screening for malignant neoplasm of colon: Secondary | ICD-10-CM | POA: Diagnosis not present

## 2021-06-28 DIAGNOSIS — K219 Gastro-esophageal reflux disease without esophagitis: Secondary | ICD-10-CM

## 2021-06-28 DIAGNOSIS — R93422 Abnormal radiologic findings on diagnostic imaging of left kidney: Secondary | ICD-10-CM

## 2021-06-28 LAB — COMPREHENSIVE METABOLIC PANEL
ALT: 18 U/L (ref 0–35)
AST: 25 U/L (ref 0–37)
Albumin: 4.3 g/dL (ref 3.5–5.2)
Alkaline Phosphatase: 54 U/L (ref 39–117)
BUN: 17 mg/dL (ref 6–23)
CO2: 30 mEq/L (ref 19–32)
Calcium: 9.4 mg/dL (ref 8.4–10.5)
Chloride: 101 mEq/L (ref 96–112)
Creatinine, Ser: 0.78 mg/dL (ref 0.40–1.20)
GFR: 79.64 mL/min (ref >60.00)
Glucose, Bld: 87 mg/dL (ref 70–99)
Potassium: 4.3 mEq/L (ref 3.5–5.1)
Sodium: 137 mEq/L (ref 135–145)
Total Bilirubin: 0.6 mg/dL (ref 0.2–1.2)
Total Protein: 6.8 g/dL (ref 6.0–8.3)

## 2021-06-28 LAB — CBC WITH DIFFERENTIAL/PLATELET
Basophils Absolute: 0.1 10*3/uL (ref 0.0–0.1)
Basophils Relative: 0.7 % (ref 0.0–3.0)
Eosinophils Absolute: 0 10*3/uL (ref 0.0–0.7)
Eosinophils Relative: 0.6 % (ref 0.0–5.0)
HCT: 40.7 % (ref 36.0–46.0)
Hemoglobin: 13.6 g/dL (ref 12.0–15.0)
Lymphocytes Relative: 25.1 % (ref 12.0–46.0)
Lymphs Abs: 1.8 10*3/uL (ref 0.7–4.0)
MCHC: 33.5 g/dL (ref 30.0–36.0)
MCV: 89.3 fl (ref 78.0–100.0)
Monocytes Absolute: 0.4 10*3/uL (ref 0.1–1.0)
Monocytes Relative: 5.6 % (ref 3.0–12.0)
Neutro Abs: 5 10*3/uL (ref 1.4–7.7)
Neutrophils Relative %: 68 % (ref 43.0–77.0)
Platelets: 279 10*3/uL (ref 150.0–400.0)
RBC: 4.56 Mil/uL (ref 3.87–5.11)
RDW: 12.7 % (ref 11.5–15.5)
WBC: 7.3 10*3/uL (ref 4.0–10.5)

## 2021-06-28 LAB — AMYLASE: Amylase: 40 U/L (ref 27–131)

## 2021-06-28 NOTE — Progress Notes (Signed)
Subjective:     Patient ID: Dawn Matthews, female    DOB: 1956/07/16, 65 y.o.   MRN: [id number]  Chief Complaint  Patient presents with   Abdominal Pain   Chest Pain    Chest tightness     Abdominal Pain Associated symptoms include nausea. Pertinent negatives include no constipation, diarrhea, fever, frequency or vomiting.  Chest Pain  Associated symptoms include abdominal pain (ruq and epigastric abdominal tenderness with radiation to the back), nausea and shortness of breath (a bit worse in the last two days even with rest, not at current). Pertinent negatives include no cough, fever, palpitations or vomiting.  Patient is in today for two day h/o a band of pain that starts in the right middle abdominal upper area that radiates around to her back side. Denies chest pain. Still with some sob even with resting as well,  states she has had this for some time but worse in the last few days. She did have a cold about two weeks ago and there is still some residual sob since then.   This am she is having a tenderness that is difficult to describe in her bil lower to upper abdomen, possibly some nausea 1/10 on the pain scale. Described as uncomfortable feeling that waxes and wanes. This morning and last night was worse, but right now not as back. She did lift heavy branches four days ago which she thinks may have contributed to maybe muscle exhaustion.   Denies constipation and or diarrhea. Denies vomiting. Denies any known heartburn but does feel tightness in epigastric area.  Oct 30th she went to move her right arm upwards and had an excruciating pain under her right breast bone which has since resolved but it was pretty significant.   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     Past Surgical History:  Procedure Laterality Date   FOOT SURGERY  2010   SKIN CANCER EXCISION      Family History  Problem Relation Age of Onset   Diabetes Mother    COPD Mother    Hypertension Mother    Atrial fibrillation Mother    Diabetes Father    Melanoma Sister    Hypertension Sister    Melanoma Brother    Lung cancer Sister        smoker   Diabetes Maternal Grandmother    Breast cancer Neg Hx     Social History   Socioeconomic History   Marital status: Single    Spouse name: Not on file   Number of children: 0   Years of education: College   Highest education level: Not on file  Occupational History   Not on file  Tobacco Use   Smoking status: Never   Smokeless tobacco: Never  Vaping Use   Vaping Use: Never used  Substance and Sexual Activity   Alcohol use: Yes    Comment: ocassionally-wine   Drug use: Never  Sexual activity: Not Currently  Other Topics Concern   Not on file  Social History Narrative   12/10/19   From: [place] - moved to [facility] in 2019 to be near family   Living: alone    Pet: dog - german shepherd   Work: retired from [employer] and [employer]      Family: sister nearby, but does have family in [place] still      Enjoys: walking, visiting small towns, crossword puzzles, drink coffee      Exercise: walking 3-4 miles per day   Diet: likes ice cream eater, trying to eat healthier now, usually skips lunch and eats early dinner      Safety   Seat belts: Yes    Guns: No   Safe in relationships: Yes    Social Determinants of Radio broadcast assistant Strain: Not on file  Food Insecurity: Not on file  Transportation Needs: Not on file  Physical Activity: Not on file  Stress:  Not on file  Social Connections: Not on file  Intimate Partner Violence: Not on file    Outpatient Medications Prior to Visit  Medication Sig Dispense Refill   Multiple Vitamins-Minerals (CENTRUM SILVER PO) Take by mouth.     VITAMIN D, CHOLECALCIFEROL, PO Take by mouth.     No facility-administered medications prior to visit.    Allergies  Allergen Reactions   Penicillins Rash   Codeine     Review of Systems  Constitutional:  Negative for chills and fever.  Respiratory:  Positive for shortness of breath (a bit worse in the last two days even with rest, not at current). Negative for cough and wheezing.   Cardiovascular:  Negative for chest pain, palpitations and leg swelling.  Gastrointestinal:  Positive for abdominal pain (ruq and epigastric abdominal tenderness with radiation to the back) and nausea. Negative for blood in stool, constipation, diarrhea, heartburn and vomiting.  Genitourinary:  Negative for flank pain, frequency and urgency.      Objective:    Physical Exam Abdominal:     General: Abdomen is flat. Bowel sounds are normal. There is no distension.     Palpations: Abdomen is soft.     Tenderness: There is abdominal tenderness in the right upper quadrant. There is no rebound. Positive signs include Murphy's sign.     Hernia: No hernia is present.  Musculoskeletal:     Lumbar back: No swelling, edema or tenderness.    BP 122/80   Pulse 74   Temp (!) 97.5 F (36.4 C) (Temporal)   Ht 5\' 3"  (1.6 m)   Wt 143 lb (64.9 kg)   SpO2 97%   BMI 25.33 kg/m  Wt Readings from Last 3 Encounters:  06/28/21 143 lb (64.9 kg)  06/14/20 143 lb 12 oz (65.2 kg)  12/10/19 142 lb 8 oz (64.6 kg)       Assessment & Plan:   Problem List Items Addressed This Visit       Other   Abnormal finding on EKG   Relevant Orders   Ambulatory referral to Cardiology   Abdominal pain, right upper quadrant - Primary   Relevant Orders   US Abdomen Complete   CBC with  Differential/Platelet   Comprehensive metabolic panel   Amylase   Shortness of breath   Relevant Orders   DG Chest 2 View   EKG 12-Lead   Screening for malignant neoplasm of colon   Relevant Orders  Cologuard    I am having Dawn Matthews "Dawn Matthews" maintain her Multiple Vitamins-Minerals (CENTRUM SILVER PO) and (VITAMIN D, CHOLECALCIFEROL, PO).  No orders of the defined types were placed in this encounter.

## 2021-06-28 NOTE — Assessment & Plan Note (Signed)
Stat chest x-ray ordered pending results patient informed if any increasing shortness of breath to go immediately to the emergency room.

## 2021-06-28 NOTE — Assessment & Plan Note (Signed)
Slightly abnormal EKG with suggested left atrial enlargement.  Patient asymptomatic at current.  Referral placed for cardiologist.  Patient advised

## 2021-06-28 NOTE — Patient Instructions (Signed)
We are ordering a stat ultrasound to rule out gallbladder cystitis.  If there is any worsening right upper quadrant abdominal tenderness and/or increased nausea or pain please go to the emergency room immediately.  As you are experiencing some shortness of breath we have also ordered a stat chest x-ray pending results.  If it anytime your shortness of breath gets any worse please do not hesitate to go to the emergency room and/or call 911  We have also ordered some labs please stop at lab on your way out the door and we will notify you of the results when they come in.  We did perform an EKG today which showed a slightly abnormal finding.  This is not an emergency but I do suggest that you follow-up with cardiology for further evaluation.  If you do experience any chest pain and/or palpitations with sudden onset please got o ER and or call 911.  It was a pleasure seeing you today! Please do not hesitate to reach out with any questions and or concerns.  Regards,   Dawn Matthews

## 2021-06-28 NOTE — Assessment & Plan Note (Signed)
Positive Murphy sign on exam patient symptomatic.  Advised she should likely go to the emergency room patient declines, prefers imaging.  Ordered ultrasound of the abdomen stat.  Pending results.  Lab work also ordered pending results.  Advised patient if any worsening abdominal pain and/or pain in general patient to go immediately to the emergency room.

## 2021-06-28 NOTE — Assessment & Plan Note (Signed)
Cologuard ordered as patient refuses colonoscopy.

## 2021-06-29 ENCOUNTER — Telehealth: Payer: Self-pay | Admitting: Family

## 2021-06-29 MED ORDER — OMEPRAZOLE 20 MG PO CPDR: 20.0000 mg | DELAYED_RELEASE_CAPSULE | Freq: Every day | ORAL | 3 refills | Status: DC

## 2021-06-29 MED ORDER — ALBUTEROL SULFATE HFA 108 (90 BASE) MCG/ACT IN AERS: 2.0000 | INHALATION_SPRAY | RESPIRATORY_TRACT | 0 refills | Status: DC | PRN

## 2021-06-29 MED ORDER — METHYLPREDNISOLONE 4 MG PO TBPK: ORAL_TABLET | ORAL | 0 refills | Status: DC

## 2021-06-29 NOTE — Telephone Encounter (Signed)
Called and spoke with patient we have changed plan of care.  Patient has verbalized understanding and acknowledgment of the new change in treatment plan.  For the suggestion of COPD on the chest x-ray I am going to refer patient to pulmonary [facility].  Patient should expect a call within the next week or so.  Patient advised to call if no call is received.  I would prefer that patient has pulmonary function test/spirometry to confirm the diagnosis prior to prescribing daily maintenance inhaler.  For now we will send a Medrol Dosepak as well as albuterol as necessary for bronchospasms patient.  Both prescription sent to preferred pharmacy.  I have also still ordered the ultrasound of the kidneys which she should also receive a call from our referral department as well.  Patient advised to let me know if she does not receive this message.   Patient also recommended follow-up in the next 2 to 3 weeks patient states that she already made a follow-up appointment with me for December 6.  We will follow-up and touch base on how patient is feeling with the addition of these medications as she may be experiencing a COPD exacerbation.  Patient advised to go to the emergency room if she has any increasing shortness of breath and/or chest pain.

## 2021-07-05 NOTE — Telephone Encounter (Signed)
Patient was called and informed me that she has gotten an appointment for her pulmonology but did not receive an appointment or call about [place] for her kidney's patient was informed if she does not hear from anyone about this tomorrow before noon to let me know so I can call and figure out what is going on with her referral. Patient stated understanding. Patient informed me me that she is feeling better on the steroid. Patient also stated she keeps inhaler near by but does not take it. Patient stated appreciation for the call.

## 2021-07-05 NOTE — Telephone Encounter (Signed)
Pt called checking on status of what Pt and Tabitha discussed.

## 2021-07-06 NOTE — Telephone Encounter (Signed)
Pt called stating that she still haven't heard anything regarding the ultra sound. Pt would like a call back.

## 2021-07-07 NOTE — Telephone Encounter (Signed)
Patient called and was given the number to schedule her [place], don't know why they did not call and schedule her when the order was seen. Patient stated she will call them and schedule right away.

## 2021-07-11 ENCOUNTER — Other Ambulatory Visit: Payer: Self-pay

## 2021-07-11 ENCOUNTER — Ambulatory Visit
Admission: RE | Admit: 2021-07-11 | Discharge: 2021-07-11 | Disposition: A | Discharge status: Home / Self Care | Payer: Medicare Other | Source: Ambulatory Visit | Attending: Family | Admitting: Family

## 2021-07-11 DIAGNOSIS — R93422 Abnormal radiologic findings on diagnostic imaging of left kidney: Secondary | ICD-10-CM | POA: Diagnosis present

## 2021-07-11 DIAGNOSIS — R93421 Abnormal radiologic findings on diagnostic imaging of right kidney: Secondary | ICD-10-CM | POA: Insufficient documentation

## 2021-07-12 ENCOUNTER — Other Ambulatory Visit: Payer: Self-pay

## 2021-07-12 ENCOUNTER — Ambulatory Visit (INDEPENDENT_AMBULATORY_CARE_PROVIDER_SITE_OTHER): Payer: Medicare Other | Admitting: Family

## 2021-07-12 ENCOUNTER — Encounter: Payer: Self-pay | Admitting: Family

## 2021-07-12 DIAGNOSIS — R9431 Abnormal electrocardiogram [ECG] [EKG]: Secondary | ICD-10-CM

## 2021-07-12 DIAGNOSIS — R0602 Shortness of breath: Secondary | ICD-10-CM

## 2021-07-12 NOTE — Patient Instructions (Signed)
F/u with cardiologist as well as pulmonary as scheduled.   It was a pleasure seeing you today! Please do not hesitate to reach out with any questions and or concerns.  Regards,   [name]

## 2021-07-12 NOTE — Assessment & Plan Note (Signed)
Completed steroid pack and has albuterol on hand if needed. Has appt with pulmonary for workup, pending appt.

## 2021-07-12 NOTE — Assessment & Plan Note (Addendum)
Pt to f/u with cardiologist , to call and make appt.   Time spent with pt in office discussing plan of care and alternative possible options totaled approximately 21 minutes.

## 2021-07-12 NOTE — Progress Notes (Addendum)
Established Patient Office Visit  Subjective:  Patient ID: Dawn Matthews, female    DOB: 1956-08-04  Age: 65 y.o. MRN: [id number]  CC: No chief complaint on file.    HPI Dawn Matthews is here today for two week f/u.  Was experiencing sob, and completed medrol dose pack and feeling much better. No longer with shortness of breath and or epigastric tightening. She is with no acute concerns today.  U/s renal yesterday 07/11/21, negative for acute findings. Per radiologist no evidence of CKD.  U/s abdomen: simple 1.5 cm right lobe liver. No evidence cholecystitis , suggestion of increased right renal cortical echotexture (r/o by u/s renal 07/11/21)  XRAY chest 06/28/21: suggestion of COPD, no acute findings. Pt referred to pulmonology for COPD workup/spirometry/PFTs, has appt 08/09/20.   EKG: pt to make appt and schedule, she will call back cardiology.   Labs: CBC, CMP and amylase within normal limits 06/28/21   Past Medical History:  Diagnosis Date  . Basal cell carcinoma   . Hyperglycemia   . Osteopenia   . 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 FOOT SURGERY  2010  . SKIN CANCER EXCISION      Family History  Problem Relation Age of Onset  . Diabetes Mother   . COPD Mother   . Hypertension Mother   . Atrial fibrillation Mother   . Diabetes Father   . Melanoma Sister   . Hypertension Sister   . Melanoma Brother   . Lung cancer Sister        smoker  . Diabetes Maternal Grandmother   . Breast cancer Neg Hx     Social History   Socioeconomic History  . Marital status: Single    Spouse name: Not on file  . Number of children: 0  . Years of education: College  . Highest education level: Not on file  Occupational History  . Not on file  Tobacco Use  . Smoking status: Never  . Smokeless tobacco: Never  Vaping Use  . Vaping Use: Never used  Substance and Sexual Activity  . Alcohol use: Yes     Comment: ocassionally-wine  . Drug use: Never  . Sexual activity: Not Currently  Other Topics Concern  . Not on file  Social History Narrative   12/10/19   From: [place] - moved to [facility] in 2019 to be near family   Living: alone    Pet: dog - german shepherd   Work: retired from [employer] and [employer]      Family: sister nearby, but does have family in [place] still      Enjoys: walking, visiting small towns, crossword puzzles, drink coffee      Exercise: walking 3-4 miles per day   Diet: likes ice cream eater, trying to eat healthier now, usually skips lunch and eats early dinner      Safety   Seat belts: Yes    Guns: No   Safe in relationships: Yes    Social Determinants of Radio broadcast assistant Strain: Not on file  Food Insecurity: Not on file  Transportation Needs: Not on file  Physical Activity: Not on file  Stress: Not on file  Social Connections: Not on file  Intimate Partner Violence: Not on file    Outpatient Medications Prior to Visit  Medication Sig Dispense Refill  . Multiple Vitamins-Minerals (CENTRUM SILVER PO)  Take by mouth.    Marland Kitchen VITAMIN D, CHOLECALCIFEROL, PO Take by mouth.    Marland Kitchen albuterol (VENTOLIN HFA) 108 (90 Base) MCG/ACT inhaler Inhale 2 puffs into the lungs every 4 (four) hours as needed for shortness of breath. 1 each 0  . methylPREDNISolone (MEDROL DOSEPAK) 4 MG TBPK tablet Take per package instructions 21 tablet 0  . omeprazole (PRILOSEC) 20 MG capsule Take 1 capsule (20 mg total) by mouth daily. 30 capsule 3   No facility-administered medications prior to visit.    Allergies  Allergen Reactions  . Penicillins Rash  . Codeine     ROS Review of Systems  Review of Systems  Respiratory:  Negative for shortness of breath.   Cardiovascular:  Negative for chest pain and palpitations.  Gastrointestinal:  Negative for constipation and diarrhea.  Genitourinary:  Negative for dysuria, frequency and urgency.   Musculoskeletal:  Negative for myalgias.  Psychiatric/Behavioral:  Negative for depression and suicidal ideas.   All other systems reviewed and are negative.    Objective:    Physical Exam  Gen: NAD, resting comfortably  CV: RRR with no murmurs appreciated Pulm: NWOB, CTAB with no crackles, wheezes, or rhonchi Skin: warm, dry Psych: Normal affect and thought content  BP 116/78   Pulse (!) 57   Temp (!) 97.5 F (36.4 C) (Temporal)   Ht 5\' 3"  (1.6 m)   Wt 144 lb (65.3 kg)   SpO2 97%   BMI 25.51 kg/m  Wt Readings from Last 3 Encounters:  07/25/21 143 lb (64.9 kg)  07/22/21 144 lb (65.3 kg)  07/12/21 144 lb (65.3 kg)     Health Maintenance Due  Topic Date Due  . Pneumonia Vaccine 41+ Years old (1 - PCV) Never done  . Zoster Vaccines- Shingrix (1 of 2) Never done  . PAP SMEAR-Modifier  05/31/2019  . DEXA SCAN  Never done  . COLON CANCER SCREENING ANNUAL FOBT  12/14/2020  . COVID-19 Vaccine (5 - Booster for Pfizer series) 02/07/2021    There are no preventive care reminders to display for this patient.  Lab Results  Component Value Date   TSH 1.39 12/10/2019   Lab Results  Component Value Date   WBC 7.3 06/28/2021   HGB 13.6 06/28/2021   HCT 40.7 06/28/2021   MCV 89.3 06/28/2021   PLT 279.0 06/28/2021   Lab Results  Component Value Date   NA 137 06/28/2021   K 4.3 06/28/2021   CO2 30 06/28/2021   GLUCOSE 87 06/28/2021   BUN 17 06/28/2021   CREATININE 0.78 06/28/2021   BILITOT 0.6 06/28/2021   ALKPHOS 54 06/28/2021   AST 25 06/28/2021   ALT 18 06/28/2021   PROT 6.8 06/28/2021   ALBUMIN 4.3 06/28/2021   CALCIUM 9.4 06/28/2021   GFR 79.64 06/28/2021   Lab Results  Component Value Date   CHOL 172 12/10/2019   Lab Results  Component Value Date   HDL 65.90 12/10/2019   Lab Results  Component Value Date   LDLCALC 95 12/10/2019   Lab Results  Component Value Date   TRIG 54.0 12/10/2019   Lab Results  Component Value Date   CHOLHDL 3  12/10/2019   Lab Results  Component Value Date   HGBA1C 5.7 12/10/2019      Assessment & Plan:   Problem List Items Addressed This Visit       Other   Abnormal finding on EKG    Pt to f/u with cardiologist , to call  and make appt.   Time spent with pt in office discussing plan of care and alternative possible options totaled approximately 21 minutes.      Shortness of breath    Completed steroid pack and has albuterol on hand if needed. Has appt with pulmonary for workup, pending appt.        No orders of the defined types were placed in this encounter.    Follow-up: Return in about 3 months (around 10/10/2021) for regular f/u .    [name], FNP

## 2021-07-19 ENCOUNTER — Encounter: Payer: Self-pay | Admitting: Emergency Medicine

## 2021-07-19 ENCOUNTER — Other Ambulatory Visit: Payer: Self-pay

## 2021-07-19 ENCOUNTER — Ambulatory Visit
Admission: EM | Admit: 2021-07-19 | Discharge: 2021-07-19 | Disposition: A | Discharge status: Home / Self Care | Payer: Medicare Other | Attending: Emergency Medicine | Admitting: Emergency Medicine

## 2021-07-19 DIAGNOSIS — B349 Viral infection, unspecified: Secondary | ICD-10-CM

## 2021-07-19 LAB — POCT INFLUENZA A/B
Influenza A, POC: NEGATIVE
Influenza B, POC: NEGATIVE

## 2021-07-19 MED ORDER — BENZONATATE 100 MG PO CAPS: 100.0000 mg | ORAL_CAPSULE | Freq: Three times a day (TID) | ORAL | 0 refills | Status: DC | PRN

## 2021-07-19 NOTE — Discharge Instructions (Addendum)
Your flu test is negative.    Take the [facility] as needed for cough.  Take Tylenol as needed for fever or discomfort.   Follow up with your primary care provider if your symptoms are not improving.

## 2021-07-19 NOTE — ED Provider Notes (Signed)
Roderic Palau    CSN: [id number] Arrival date & time: 07/19/21  0809      History   Chief Complaint Chief Complaint  Patient presents with   Cough   Headache   Sore Throat   Generalized Body Aches    HPI Dawn Matthews is a 65 y.o. female.  Patient presents with 2-day history of headache, postnasal drip, sore throat, cough.  No fever, rash, shortness of breath, vomiting, diarrhea, or other symptoms.  Treatment at home with ibuprofen.  Patient has also been using albuterol inhaler as needed.  She was seen by her PCP on 06/28/2021; started on Medrol dose pack; labs, EKG, CXR, RUQ ultrasound, renal ultrasound done. CXR showed COPD; Lab work unremarkable.  She was seen again by her PCP on 07/12/2021 for a 2 week recheck; no additional treatment.  Her medical history includes prediabetes, osteopenia, chronic fatigue, basal cell carcinoma, squamous cell carcinoma.  The history is provided by the patient and medical records.   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      Home Medications    Prior to Admission medications   Medication Sig Start Date End Date Taking? Authorizing Provider  benzonatate (TESSALON) 100 MG capsule Take 1 capsule (100 mg total) by mouth 3 (three) times daily as needed for cough. 07/19/21  Yes Sharion Balloon, NP  albuterol (VENTOLIN HFA) 108 (90 Base) MCG/ACT inhaler Inhale 2 puffs into the lungs every 4 (four) hours as needed for shortness of breath.  06/29/21   [name], FNP  Multiple Vitamins-Minerals (CENTRUM SILVER PO) Take by mouth.    [provider]  omeprazole (PRILOSEC) 20 MG capsule Take 1 capsule (20 mg total) by mouth daily. 06/29/21   [name], FNP  VITAMIN D, CHOLECALCIFEROL, PO Take by mouth.    [provider]    Family History Family History  Problem Relation Age of Onset   Diabetes Mother    COPD Mother    Hypertension Mother    Atrial fibrillation Mother    Diabetes Father    Melanoma Sister    Hypertension Sister    Melanoma Brother    Lung cancer Sister        smoker   Diabetes Maternal Grandmother    Breast cancer Neg Hx     Social History Social History   Tobacco Use   Smoking status: Never   Smokeless tobacco: Never  Vaping Use   Vaping Use: Never used  Substance Use Topics   Alcohol use: Yes    Comment: ocassionally-wine   Drug use: Never     Allergies   Penicillins and Codeine   Review of Systems Review of Systems  Constitutional:  Negative for chills and fever.  HENT:  Positive for postnasal drip and sore throat. Negative for ear pain.   Respiratory:  Positive for cough. Negative for shortness of breath.  Cardiovascular:  Negative for chest pain and palpitations.  Gastrointestinal:  Negative for abdominal pain, diarrhea and vomiting.  Skin:  Negative for color change and rash.  Neurological:  Positive for headaches. Negative for dizziness.  All other systems reviewed and are negative.   Physical Exam Triage Vital Signs ED Triage Vitals  Enc Vitals Group     BP      Pulse      Resp      Temp      Temp src      SpO2      Weight      Height      Head Circumference      Peak Flow      Pain Score      Pain Loc      Pain Edu?      Excl. in [place]?    No data found.  Updated Vital Signs BP 123/83 (BP Location: Left Arm)   Pulse 83   Temp 98.8 F (37.1 C) (Oral)   Resp 18   SpO2 96%   Visual Acuity Right Eye Distance:   Left Eye  Distance:   Bilateral Distance:    Right Eye Near:   Left Eye Near:    Bilateral Near:     Physical Exam Vitals and nursing note reviewed.  Constitutional:      General: She is not in acute distress.    Appearance: She is well-developed.  HENT:     Right Ear: Tympanic membrane normal.     Left Ear: Tympanic membrane normal.     Nose: Nose normal.     Mouth/Throat:     Mouth: Mucous membranes are moist.     Pharynx: Oropharynx is clear.     Comments: Clear postnasal drip.  Cardiovascular:     Rate and Rhythm: Normal rate and regular rhythm.     Heart sounds: Normal heart sounds.  Pulmonary:     Effort: Pulmonary effort is normal. No respiratory distress.     Breath sounds: Normal breath sounds.  Abdominal:     Palpations: Abdomen is soft.     Tenderness: There is no abdominal tenderness.  Musculoskeletal:     Cervical back: Neck supple.  Skin:    General: Skin is warm and dry.  Neurological:     Mental Status: She is alert.  Psychiatric:        Mood and Affect: Mood normal.        Behavior: Behavior normal.     UC Treatments / Results  Labs (all labs ordered are listed, but only abnormal results are displayed) Labs Reviewed  POCT INFLUENZA A/B    EKG   Radiology No results found.  Procedures Procedures (including critical care time)  Medications Ordered in UC Medications - No data to display  Initial Impression / Assessment and Plan / UC Course  I have reviewed the triage vital signs and the nursing notes.  Pertinent labs & imaging results that were available during my care of the patient were reviewed by me and considered in my medical decision making (see chart for details).   Viral illness.  Rapid flu negative.  Patient declines COVID test.  She took 3 at home COVID test which were negative.  Treating cough with Tessalon Perles.  Instructed patient to take Tylenol as needed for fever or discomfort.  Instructed her to follow-up with her PCP if her  symptoms are not improving.  She agrees to plan of care.  Final Clinical Impressions(s) / UC Diagnoses   Final diagnoses:  Viral illness     Discharge Instructions      Your flu test is negative.    Take the [facility] as needed for cough.  Take Tylenol as needed for fever or discomfort.   Follow up with your primary care provider if your symptoms are not improving.        ED Prescriptions     Medication Sig Dispense Auth. Provider   benzonatate (TESSALON) 100 MG capsule Take 1 capsule (100 mg total) by mouth 3 (three) times daily as needed for cough. 21 capsule Sharion Balloon, NP      PDMP not reviewed this encounter.   Sharion Balloon, NP 07/19/21 [phone]

## 2021-07-19 NOTE — ED Triage Notes (Signed)
Pt here with slight cough, headache, sore throat and body aches x 2 days.

## 2021-07-22 ENCOUNTER — Other Ambulatory Visit: Payer: Self-pay

## 2021-07-22 ENCOUNTER — Encounter: Payer: Self-pay | Admitting: Family Medicine

## 2021-07-22 ENCOUNTER — Ambulatory Visit: Payer: Medicare Other | Admitting: Family Medicine

## 2021-07-22 DIAGNOSIS — R0602 Shortness of breath: Secondary | ICD-10-CM

## 2021-07-22 DIAGNOSIS — J069 Acute upper respiratory infection, unspecified: Secondary | ICD-10-CM | POA: Diagnosis not present

## 2021-07-22 MED ORDER — METHYLPREDNISOLONE 4 MG PO TBPK: ORAL_TABLET | ORAL | 0 refills | Status: DC

## 2021-07-22 NOTE — Patient Instructions (Addendum)
Tessalon for cough  Inhaler for cough and any tight/wheeze/short of breath  Take the prednisone as directed   Rest  Fluids   I think this is viral bronchitis   Watch for productive cough /fever worse shortness of breath  If severe - go to ER   Update if not starting to improve in a week or if worsening

## 2021-07-22 NOTE — Progress Notes (Signed)
Subjective:    Patient ID: Dawn Matthews, female    DOB: 1956-05-14, 65 y.o.   MRN: [id number]  This visit occurred during the SARS-CoV-2 public health emergency.  Safety protocols were in place, including screening questions prior to the visit, additional usage of staff PPE, and extensive cleaning of exam room while observing appropriate contact time as indicated for disinfecting solutions.   HPI 65 yo pt of Dr Einar Pheasant presents for f/u of UC visit for viral illness   Wt Readings from Last 3 Encounters:  07/22/21 144 lb (65.3 kg)  07/12/21 144 lb (65.3 kg)  06/28/21 143 lb (64.9 kg)   25.51 kg/m  At uc: 07/19/21 Dawn Matthews is a 65 y.o. female.  Patient presents with 2-day history of headache, postnasal drip, sore throat, cough.  No fever, rash, shortness of breath, vomiting, diarrhea, or other symptoms.  Treatment at home with ibuprofen.  Patient has also been using albuterol inhaler as needed.  She was seen by her PCP on 06/28/2021; started on Medrol dose pack; labs, EKG, CXR, RUQ ultrasound, renal ultrasound done. CXR showed COPD; Lab work unremarkable.  She was seen again by her PCP on 07/12/2021 for a 2 week recheck; no additional treatment.  Her medical history includes prediabetes, osteopenia, chronic fatigue, basal cell carcinoma, squamous cell carcinoma.  Has appt with pulmonary next month   Sister died of lung cancer  Grew up in a household of smoke   A/p:  Viral illness.  Rapid flu negative.  Patient declines COVID test.  She took 3 at home COVID test which were negative.  Treating cough with Tessalon Perles.  Instructed patient to take Tylenol as needed for fever or discomfort.  Instructed her to follow-up with her PCP if her symptoms are not improving.  She agrees to plan of care. Neg flu and covid testing  Used inhaler last night Dry cough   Last cxr DG Chest 2 View (Accession [id number]) (Order [id number]) Imaging Date: 06/28/2021 Department: [facility] Released By: Leeanne Rio, CMA Authorizing: [name], FNP   Exam Status  Status  Final [99]   PACS Intelerad Image Link   Show images for DG Chest 2 View  Study Result  Narrative & Impression  CLINICAL DATA:  Shortness of breath   EXAM: CHEST - 2 VIEW   COMPARISON:  None.   FINDINGS: Cardiac size is within normal limits. Low position of diaphragms suggests COPD. There are no signs of pulmonary edema or focal pulmonary consolidation. There is no pleural effusion or pneumothorax.   IMPRESSION: COPD. There are no signs of pulmonary edema or focal pulmonary consolidation.     Electronically Signed   By: Elmer Picker M.D.   On: 06/28/2021 10:49    She never needed the cough medicine   Last night more head congestion  Felt like she had trouble breathing (got panicky) She used the albuterol inhaler and it helped  Now cough is dry / a little rattling  Not wheezing  Not tight but breathing is labored   Nasal congestion is bad  Today a little better  Nasal mucous is not clear  Not a lot of sinus pain or pressure today  Ears feel funny /crackling  Throat was sore and now improved /no longer sore   Very hoarse   Otc: plain mucinex   Patient Active Problem List   Diagnosis Date Noted   Abnormal finding on EKG 06/28/2021   Shortness of  breath 06/28/2021   Basal cell carcinoma 06/14/2020   Prediabetes 12/10/2019   Osteopenia 12/10/2019   Chronic fatigue 12/10/2019   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    Past Surgical History:  Procedure Laterality Date   FOOT SURGERY  2010   SKIN CANCER EXCISION     Social History   Tobacco Use   Smoking status: Never   Smokeless tobacco: Never  Vaping Use   Vaping Use: Never used  Substance Use Topics   Alcohol use: Yes    Comment: ocassionally-wine    Drug use: Never   Family History  Problem Relation Age of Onset   Diabetes Mother    COPD Mother    Hypertension Mother    Atrial fibrillation Mother    Diabetes Father    Melanoma Sister    Hypertension Sister    Melanoma Brother    Lung cancer Sister        smoker   Diabetes Maternal Grandmother    Breast cancer Neg Hx    Allergies  Allergen Reactions   Penicillins Rash   Codeine    Current Outpatient Medications on File Prior to Visit  Medication Sig Dispense Refill   albuterol (VENTOLIN HFA) 108 (90 Base) MCG/ACT inhaler Inhale 2 puffs into the lungs every 4 (four) hours as needed for shortness of breath. 1 each 0   guaiFENesin (MUCINEX PO) Take 2 tablets by mouth 2 (two) times daily as needed.     Multiple Vitamins-Minerals (CENTRUM SILVER PO) Take by mouth.     omeprazole (PRILOSEC) 20 MG capsule Take 1 capsule (20 mg total) by mouth daily. 30 capsule 3   VITAMIN D, CHOLECALCIFEROL, PO Take by mouth.     benzonatate (TESSALON) 100 MG capsule Take 1 capsule (100 mg total) by mouth 3 (three) times daily as needed for cough. (Patient not taking: Reported on 07/22/2021) 21 capsule 0   No current facility-administered medications on file prior to visit.    Review of Systems  Constitutional:  Positive for fatigue. Negative for activity change, appetite change, fever and unexpected weight change.  HENT:  Positive for congestion, rhinorrhea and voice change. Negative for ear pain, sinus pressure and sore throat.   Eyes:  Negative for pain, redness and visual disturbance.  Respiratory:  Positive for cough and shortness of breath. Negative for wheezing.   Cardiovascular:  Negative for chest pain and palpitations.  Gastrointestinal:  Negative for abdominal pain, blood in stool, constipation and diarrhea.  Endocrine: Negative for polydipsia and polyuria.  Genitourinary:  Negative for dysuria, frequency and urgency.  Musculoskeletal:  Negative for arthralgias, back pain and  myalgias.  Skin:  Negative for pallor and rash.  Allergic/Immunologic: Negative for environmental allergies.  Neurological:  Positive for headaches. Negative for dizziness and syncope.  Hematological:  Negative for adenopathy. Does not bruise/bleed easily.  Psychiatric/Behavioral:  Negative for decreased concentration and dysphoric mood. The patient is not nervous/anxious.       Objective:   Physical Exam Constitutional:      General: She is not in acute distress.    Appearance: Normal appearance. She is well-developed and normal weight. She is not ill-appearing, toxic-appearing or diaphoretic.  HENT:     Head: Normocephalic and atraumatic.     Comments: Nares are injected and congested      Right Ear: Tympanic membrane, ear  canal and external ear normal.     Left Ear: Tympanic membrane, ear canal and external ear normal.     Ears:     Comments: Scant cerumen bilaterally     Nose: Congestion and rhinorrhea present.     Mouth/Throat:     Mouth: Mucous membranes are moist.     Pharynx: Oropharynx is clear. No oropharyngeal exudate or posterior oropharyngeal erythema.     Comments: Clear pnd  Eyes:     General:        Right eye: No discharge.        Left eye: No discharge.     Conjunctiva/sclera: Conjunctivae normal.     Pupils: Pupils are equal, round, and reactive to light.  Cardiovascular:     Rate and Rhythm: Normal rate.     Heart sounds: Normal heart sounds.  Pulmonary:     Effort: Pulmonary effort is normal. No respiratory distress.     Breath sounds: Normal breath sounds. No stridor. No wheezing, rhonchi or rales.     Comments: Good air exch  Some upper airway sounds  Chest:     Chest wall: No tenderness.  Musculoskeletal:     Cervical back: Normal range of motion and neck supple.  Lymphadenopathy:     Cervical: No cervical adenopathy.  Skin:    General: Skin is warm and dry.     Capillary Refill: Capillary refill takes less than 2 seconds.     Findings: No  erythema or rash.  Neurological:     Mental Status: She is alert.     Cranial Nerves: No cranial nerve deficit.  Psychiatric:        Mood and Affect: Mood normal.          Assessment & Plan:   Problem List Items Addressed This Visit       Respiratory   URI with cough and congestion    Suspect viral  Improved from last night  Reviewed notes from here and also UC in detail and imaging  Suspect she has had 2 viral illnesses in a row Mild bronchitis most likely  Px medrol dose pk after discussion of pot side eff  Reassuring exam Symptom care  ER precautions discussed         Other   Shortness of breath    With recent viral uri  Brief episode last night with nasal congestion  Reassuring /better today  Px medrol dose pack for acute bronchitis (in setting of poss copd)  She has pulmonary f/u planned Update if not starting to improve in a week or if worsening  ER precautions discussed      Relevant Medications   methylPREDNISolone (MEDROL DOSEPAK) 4 MG TBPK tablet

## 2021-07-22 NOTE — Assessment & Plan Note (Addendum)
Suspect viral  Improved from last night  Reviewed notes from here and also UC in detail and imaging  Suspect she has had 2 viral illnesses in a row Mild bronchitis most likely  Px medrol dose pk after discussion of pot side eff  Reassuring exam Symptom care  ER precautions discussed

## 2021-07-22 NOTE — Assessment & Plan Note (Addendum)
With recent viral uri  Brief episode last night with nasal congestion  Reassuring /better today  Px medrol dose pack for acute bronchitis (in setting of poss copd)  She has pulmonary f/u planned Update if not starting to improve in a week or if worsening  ER precautions discussed

## 2021-07-25 ENCOUNTER — Ambulatory Visit: Payer: Medicare Other | Admitting: Family

## 2021-07-25 ENCOUNTER — Encounter: Payer: Self-pay | Admitting: Family

## 2021-07-25 ENCOUNTER — Other Ambulatory Visit: Payer: Self-pay

## 2021-07-25 VITALS — BP 132/88 | HR 87 | Temp 98.1°F | Ht 63.0 in | Wt 143.0 lb

## 2021-07-25 DIAGNOSIS — J440 Chronic obstructive pulmonary disease with acute lower respiratory infection: Secondary | ICD-10-CM | POA: Diagnosis not present

## 2021-07-25 DIAGNOSIS — R0602 Shortness of breath: Secondary | ICD-10-CM

## 2021-07-25 DIAGNOSIS — J209 Acute bronchitis, unspecified: Secondary | ICD-10-CM | POA: Diagnosis not present

## 2021-07-25 MED ORDER — AZITHROMYCIN 250 MG PO TABS: ORAL_TABLET | ORAL | 0 refills | Status: AC

## 2021-07-25 MED ORDER — MONTELUKAST SODIUM 10 MG PO TABS: 10.0000 mg | ORAL_TABLET | Freq: Every day | ORAL | 2 refills | Status: DC

## 2021-07-25 MED ORDER — ALBUTEROL SULFATE HFA 108 (90 BASE) MCG/ACT IN AERS: 2.0000 | INHALATION_SPRAY | RESPIRATORY_TRACT | 0 refills | Status: DC | PRN

## 2021-07-25 MED ORDER — FLUTICASONE-SALMETEROL 250-50 MCG/ACT IN AEPB: 1.0000 | INHALATION_SPRAY | Freq: Two times a day (BID) | RESPIRATORY_TRACT | 1 refills | Status: DC

## 2021-07-25 NOTE — Progress Notes (Signed)
Established Patient Office Visit  Subjective:  Patient ID: Dawn Matthews, female    DOB: 07/22/1956  Age: 65 y.o. MRN: [id number]  CC:  Chief Complaint  Patient presents with   Cough    HAs been to see Dr Glori Bickers and several UC visits for cough. Dry cough.     HPI Dawn Matthews is here today for increasing concerns.  Saw Dr. Glori Bickers 07/22/21 was given rx for medrol dose pack as suspected viral etiology. However, over the weekend pt with increasing sob and cough spasms that made it hard to catch her breath. A little bit better today, but still a lot of cough. She feels ' I need an antiobitic) she does not have f/u with pulmonary until mid January. Yesterday coughing so much she felt like she needed to throw up. No fever and or chills. No ear pain. Mild sore scratchy throat. Some chest congestion. She tested at home this am, and covid negative.  Sx have now been for the last eight days, and not improving if anything getting worse.   She is using albuterol inhaler prn, did have to use it about five days ago, and then since taking about twice daily.   12/14 went to urgent care , flu test negative. Given tessalon perrles for cough. Still has two more days of steroids.  Last cxr 06/28/21 which did indicate possible COPD hence consult with pulmonary.  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     Past Surgical History:  Procedure Laterality Date   FOOT SURGERY  2010   SKIN CANCER EXCISION      Family History  Problem Relation Age of Onset   Diabetes Mother    COPD Mother    Hypertension Mother    Atrial fibrillation Mother    Diabetes Father    Melanoma Sister    Hypertension Sister    Melanoma Brother    Lung cancer Sister        smoker   Diabetes Maternal Grandmother    Breast cancer Neg Hx     Social History   Socioeconomic History   Marital status: Single     Spouse name: Not on file   Number of children: 0   Years of education: College   Highest education level: Not on file  Occupational History   Not on file  Tobacco Use   Smoking status: Never   Smokeless tobacco: Never  Vaping Use   Vaping Use: Never used  Substance and Sexual Activity   Alcohol use: Yes    Comment: ocassionally-wine   Drug use: Never   Sexual activity: Not Currently  Other Topics Concern   Not on file  Social History Narrative   12/10/19   From: [place] - moved to [place] in 2019 to be near family   Living: alone    Pet: dog - german shepherd   Work: retired from [employer] and [employer]      Family: sister nearby, but does have family in [place] still      Enjoys: walking, visiting small towns, crossword puzzles, drink coffee      Exercise: walking 3-4 miles per day   Diet: likes ice cream eater, trying to eat healthier now, usually skips lunch and eats early dinner      Safety   Seat belts: Yes  Guns: No   Safe in relationships: Yes    Social Determinants of Radio broadcast assistant Strain: Not on file  Food Insecurity: Not on file  Transportation Needs: Not on file  Physical Activity: Not on file  Stress: Not on file  Social Connections: Not on file  Intimate Partner Violence: Not on file    Outpatient Medications Prior to Visit  Medication Sig Dispense Refill   methylPREDNISolone (MEDROL DOSEPAK) 4 MG TBPK tablet Take per package instructions 21 tablet 0   Multiple Vitamins-Minerals (CENTRUM SILVER PO) Take by mouth.     VITAMIN D, CHOLECALCIFEROL, PO Take by mouth.     albuterol (VENTOLIN HFA) 108 (90 Base) MCG/ACT inhaler Inhale 2 puffs into the lungs every 4 (four) hours as needed for shortness of breath. 1 each 0   guaiFENesin (MUCINEX PO) Take 2 tablets by mouth 2 (two) times daily as needed. (Patient not taking: Reported on 07/25/2021)     benzonatate (TESSALON) 100 MG capsule Take 1 capsule (100 mg total) by mouth 3  (three) times daily as needed for cough. (Patient not taking: Reported on 07/22/2021) 21 capsule 0   omeprazole (PRILOSEC) 20 MG capsule Take 1 capsule (20 mg total) by mouth daily. 30 capsule 3   No facility-administered medications prior to visit.    Allergies  Allergen Reactions   Penicillins Rash   Codeine     ROS Review of Systems  Constitutional:  Negative for chills and fever.  HENT:  Positive for sore throat. Negative for congestion, ear pain and sinus pressure.   Respiratory:  Positive for cough (wet productive cough) and shortness of breath. Negative for wheezing. Stridor: albuterol inhaler twice daily.  Cardiovascular:  Negative for chest pain and palpitations.     Objective:    Physical Exam Constitutional:      General: She is not in acute distress.    Appearance: Normal appearance. She is not ill-appearing.  HENT:     Head: Normocephalic.     Right Ear: Tympanic membrane normal.     Left Ear: Tympanic membrane normal.     Nose: Nose normal. No congestion or rhinorrhea.     Mouth/Throat:     Mouth: Mucous membranes are moist.     Pharynx: No pharyngeal swelling, oropharyngeal exudate or posterior oropharyngeal erythema.     Tonsils: No tonsillar exudate.  Eyes:     Extraocular Movements: Extraocular movements intact.     Conjunctiva/sclera: Conjunctivae normal.     Pupils: Pupils are equal, round, and reactive to light.  Neck:     Thyroid: No thyroid mass.  Cardiovascular:     Rate and Rhythm: Normal rate and regular rhythm.  Pulmonary:     Effort: Pulmonary effort is normal.     Breath sounds: Normal breath sounds.  Lymphadenopathy:     Cervical:     Right cervical: No superficial cervical adenopathy.    Left cervical: No superficial cervical adenopathy.  Neurological:     Mental Status: She is alert.    BP 132/88 (BP Location: Left Arm, Patient Position: Sitting, Cuff Size: Normal)    Pulse 87    Temp 98.1 F (36.7 C)    Ht 5\' 3"  (1.6 m)    Wt 143  lb (64.9 kg)    SpO2 97%    BMI 25.33 kg/m  Wt Readings from Last 3 Encounters:  07/25/21 143 lb (64.9 kg)  07/22/21 144 lb (65.3 kg)  07/12/21 144 lb (65.3 kg)  Health Maintenance Due  Topic Date Due   Pneumonia Vaccine 46+ Years old (1 - PCV) Never done   Zoster Vaccines- Shingrix (1 of 2) Never done   PAP SMEAR-Modifier  05/31/2019   DEXA SCAN  Never done   COLON CANCER SCREENING ANNUAL FOBT  12/14/2020   COVID-19 Vaccine (5 - Booster for Pfizer series) 02/07/2021    There are no preventive care reminders to display for this patient.  Lab Results  Component Value Date   TSH 1.39 12/10/2019   Lab Results  Component Value Date   WBC 7.3 06/28/2021   HGB 13.6 06/28/2021   HCT 40.7 06/28/2021   MCV 89.3 06/28/2021   PLT 279.0 06/28/2021   Lab Results  Component Value Date   NA 137 06/28/2021   K 4.3 06/28/2021   CO2 30 06/28/2021   GLUCOSE 87 06/28/2021   BUN 17 06/28/2021   CREATININE 0.78 06/28/2021   BILITOT 0.6 06/28/2021   ALKPHOS 54 06/28/2021   AST 25 06/28/2021   ALT 18 06/28/2021   PROT 6.8 06/28/2021   ALBUMIN 4.3 06/28/2021   CALCIUM 9.4 06/28/2021   GFR 79.64 06/28/2021   Lab Results  Component Value Date   HGBA1C 5.7 12/10/2019      Assessment & Plan:   Problem List Items Addressed This Visit       Respiratory   Acute bronchitis with chronic obstructive pulmonary disease (COPD) ([place])    New rx sent to pharmacy for singulair 10 mg po qhs , also sent rx for zpack pt to start and take as directed. F/u with pulm as scheduled. May consider addition of advair for control if no relief as lungs are clear to auscultation on PE. If increasing sob pt to go to er and or call 911.      Relevant Medications   azithromycin (ZITHROMAX) 250 MG tablet   albuterol (VENTOLIN HFA) 108 (90 Base) MCG/ACT inhaler   montelukast (SINGULAIR) 10 MG tablet     Other   Shortness of breath - Primary    rx given for singulair. Albuterol refilled.       Relevant Medications   azithromycin (ZITHROMAX) 250 MG tablet   albuterol (VENTOLIN HFA) 108 (90 Base) MCG/ACT inhaler    Meds ordered this encounter  Medications   azithromycin (ZITHROMAX) 250 MG tablet    Sig: Take 2 tablets on day 1, then 1 tablet daily on days 2 through 5    Dispense:  6 tablet    Refill:  0    Order Specific Question:   Supervising Provider    Answer:   BEDSOLE, AMY E [[id number]]   albuterol (VENTOLIN HFA) 108 (90 Base) MCG/ACT inhaler    Sig: Inhale 2 puffs into the lungs every 4 (four) hours as needed for shortness of breath.    Dispense:  1 each    Refill:  0    Order Specific Question:   Supervising Provider    Answer:   BEDSOLE, AMY E [[id number]]   DISCONTD: fluticasone-salmeterol (ADVAIR DISKUS) 250-50 MCG/ACT AEPB    Sig: Inhale 1 puff into the lungs in the morning and at bedtime.    Dispense:  30 each    Refill:  1    Order Specific Question:   Supervising Provider    Answer:   BEDSOLE, AMY E [[id number]]   montelukast (SINGULAIR) 10 MG tablet    Sig: Take 1 tablet (10 mg total) by mouth at bedtime.    Dispense:  30 tablet    Refill:  2    Order Specific Question:   Supervising Provider    Answer:   Diona Browner, AMY E [[id number]]    Follow-up: Return in about 2 days (around 07/27/2021), or if symptoms worsen or fail to improve.    [name], FNP

## 2021-07-25 NOTE — Patient Instructions (Addendum)
Antibiotic sent to preferred pharmacy.   I have sent in a new RX for singulair which you will start taking daily.  The rescue albuterol inhaler is to use only when needed for shortness of breath.   Follow up with pulmonary as scheduled.  Please increase oral fluids, steamy hot shower/humidifier prn.  Please follow up if no improvement in 2-3 days.   It was a pleasure seeing you today! Please do not hesitate to reach out with any questions and or concerns.  Regards,   [name]

## 2021-07-25 NOTE — Assessment & Plan Note (Signed)
New rx sent to pharmacy for singulair 10 mg po qhs , also sent rx for zpack pt to start and take as directed. F/u with pulm as scheduled. May consider addition of advair for control if no relief as lungs are clear to auscultation on PE. If increasing sob pt to go to er and or call 911.

## 2021-07-25 NOTE — Assessment & Plan Note (Signed)
rx given for singulair. Albuterol refilled.

## 2021-08-03 ENCOUNTER — Other Ambulatory Visit: Payer: Self-pay | Admitting: Family Medicine

## 2021-08-03 DIAGNOSIS — Z1231 Encounter for screening mammogram for malignant neoplasm of breast: Secondary | ICD-10-CM

## 2021-08-09 ENCOUNTER — Encounter: Payer: Self-pay | Admitting: Pulmonary Disease

## 2021-08-09 ENCOUNTER — Other Ambulatory Visit
Admission: RE | Admit: 2021-08-09 | Discharge: 2021-08-09 | Disposition: A | Discharge status: Home / Self Care | Payer: Medicare Other | Source: Ambulatory Visit | Attending: Pulmonary Disease | Admitting: Pulmonary Disease

## 2021-08-09 ENCOUNTER — Other Ambulatory Visit: Payer: Self-pay

## 2021-08-09 ENCOUNTER — Ambulatory Visit: Payer: Medicare Other | Admitting: Pulmonary Disease

## 2021-08-09 VITALS — BP 124/82 | HR 63 | Temp 97.3°F | Ht 64.0 in | Wt 148.8 lb

## 2021-08-09 DIAGNOSIS — Z01812 Encounter for preprocedural laboratory examination: Secondary | ICD-10-CM | POA: Insufficient documentation

## 2021-08-09 DIAGNOSIS — R059 Cough, unspecified: Secondary | ICD-10-CM

## 2021-08-09 DIAGNOSIS — R0602 Shortness of breath: Secondary | ICD-10-CM

## 2021-08-09 DIAGNOSIS — Z20822 Contact with and (suspected) exposure to covid-19: Secondary | ICD-10-CM | POA: Diagnosis not present

## 2021-08-09 DIAGNOSIS — K219 Gastro-esophageal reflux disease without esophagitis: Secondary | ICD-10-CM | POA: Diagnosis not present

## 2021-08-09 LAB — SARS CORONAVIRUS 2 (TAT 6-24 HRS): SARS Coronavirus 2: NEGATIVE

## 2021-08-09 NOTE — Progress Notes (Signed)
Subjective:    Patient ID: Dawn Matthews, female    DOB: 07-Jan-1956, 66 y.o.   MRN: [id number] Chief Complaint  Patient presents with   Consult    COPD-   HPI Patient is a 66 year old lifelong never smoker who presents for evaluation of potential COPD.  She is kindly referred by [name], FNP.  Her primary care physician is Waunita Schooner, MD. the impression of COPD is by chest x-ray performed on 22 November.  Patient states that she has had longstanding issues with dyspnea on exertion ever since a young age.  However in mid November she states that she was "not feeling well" and noticed that this worsened somewhat.  She was given an albuterol inhaler which has helped to some degree but is not long-lasting.  She noted some right upper quadrant pain which subsequently subsided.  She has not noted any nausea or vomiting.  She also notes that her issues in November started after she was exposed to dry leads in her yard while she was raking them.  She has a cough that has been mostly nonproductive.  She has had prednisone x2 since November which also helped her breathing.  Her cough now is limited to the mornings and again is nonproductive when she does produce some sputum that is usually white.  She has occasional globus sensation she has had some GERD symptoms with heartburn noted.  She has not had any fevers, chills or sweats.  Only the sensation that she does not feel well with shortness of breath.  A chest x-ray was obtained on 22 November that was read as having changes consistent with COPD.  While it was described that she had lower diaphragms, she does not have diaphragm flattening consistent with COPD.  There is home hyperinflation however this could be related to other diseases such as asthma.  The patient has not had occupational exposures.  She is retired from office work.  She notes that her mother died of emphysema at age 80 however mother was a smoker.  Dad was deceased at 8 due to  complications from diabetes also was a smoker.  1 sister died at age 16 of lung cancer also a smoker.  Surviving sister has melanoma.   Review of Systems A 10 point review of systems was performed and it is as noted above otherwise negative. 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    Past Surgical History:  Procedure Laterality Date   FOOT SURGERY  2010   SKIN CANCER EXCISION     Family History  Problem Relation Age of Onset   Diabetes Mother    COPD Mother    Hypertension Mother    Atrial fibrillation Mother    Diabetes Father    Melanoma Sister    Hypertension Sister    Melanoma Brother    Lung cancer Sister        smoker   Diabetes Maternal Grandmother    Breast cancer Neg Hx    Social History   Tobacco Use   Smoking status: Never   Smokeless tobacco: Never  Substance Use Topics   Alcohol use: Yes    Comment: ocassionally-wine   Current Meds  Medication Sig   albuterol (VENTOLIN HFA) 108 (90 Base) MCG/ACT inhaler Inhale 2 puffs into the lungs every 4 (four) hours as needed for shortness of breath.  montelukast (SINGULAIR) 10 MG tablet Take 1 tablet (10 mg total) by mouth at bedtime.   Multiple Vitamins-Minerals (CENTRUM SILVER PO) Take by mouth.   VITAMIN D, CHOLECALCIFEROL, PO Take by mouth.   [DISCONTINUED] guaiFENesin (MUCINEX PO) Take 2 tablets by mouth 2 (two) times daily as needed.   [DISCONTINUED] methylPREDNISolone (MEDROL DOSEPAK) 4 MG TBPK tablet Take per package instructions   Immunization History  Administered Date(s) Administered   Fluad Quad(high Dose 65+) 05/07/2021   PFIZER(Purple Top)SARS-COV-2 Vaccination 10/16/2019, 11/06/2019, 06/16/2020, 12/13/2020   Tdap 06/14/2020      Objective:   Physical Exam BP 124/82 (BP Location: Left Arm, Patient Position: Sitting, Cuff Size: Normal)    Pulse 63    Temp (!) 97.3 F (36.3 C) (Oral)    Ht  5\' 4"  (1.626 m)    Wt 148 lb 12.8 oz (67.5 kg)    SpO2 99%    BMI 25.54 kg/m  GENERAL: Well-developed, well-nourished woman, no acute distress.  Fully ambulatory.  No conversational dyspnea. HEAD: Normocephalic, atraumatic.  EYES: Pupils equal, round, reactive to light.  No scleral icterus.  MOUTH: Nose/mouth/throat not examined due to masking requirements for COVID 19. NECK: Supple. No thyromegaly. Trachea midline. No JVD.  No adenopathy. PULMONARY: Good air entry bilaterally.  No adventitious sounds. CARDIOVASCULAR: S1 and S2. Regular rate and rhythm.  No rubs, murmurs or gallops heard. ABDOMEN: Benign. MUSCULOSKELETAL: No joint deformity, no clubbing, no edema.  NEUROLOGIC: No focal deficit, no gait disturbance, speech is fluent. SKIN: Intact,warm,dry.  Multiple areas particularly on face treated for basal cell carcinoma. PSYCH: Mood and behavior normal.  Chest x-ray obtained 28 June 2021, independently reviewed:       Assessment & Plan:     ICD-10-CM   1. SOB (shortness of breath)  R06.02 Allergen Panel (27) + IGE    Pulmonary Function Test [facility] Only    Alpha-1 antitrypsin phenotype    CBC With Differential   Will obtain PFTs Allergen panel CBC Alpha-1 Suspect asthma Cannot exclude hereditary emphysema    2. Cough, unspecified type  R05.9 Allergen Panel (27) + IGE    Pulmonary Function Test [facility] Only    Alpha-1 antitrypsin phenotype    CBC With Differential   PFTs as above     3. Gastroesophageal reflux disease, unspecified whether esophagitis present  K21.9    Advised with regards to antireflux measures May take famotidine at nighttime as needed     Orders Placed This Encounter  Procedures   Allergen Panel (27) + IGE    Standing Status:   Future    Number of Occurrences:   1    Standing Expiration Date:   08/09/2022   Alpha-1 antitrypsin phenotype    Standing Status:   Future    Number of Occurrences:   1    Standing Expiration Date:   08/09/2022   CBC With  Differential    Standing Status:   Future    Number of Occurrences:   1    Standing Expiration Date:   08/09/2022   Pulmonary Function Test [facility] Only    Standing Status:   Future    Number of Occurrences:   1    Standing Expiration Date:   08/09/2022    Order Specific Question:   Full PFT: includes the following: basic spirometry, spirometry pre & post bronchodilator, diffusion capacity (DLCO), lung volumes    Answer:   Full PFT    Order Specific Question:   This test can  only be performed at    Answer:   [facility]   Await results from PFTs.  I recommend that she use her as needed albuterol when she experiences shortness of breath and/or intractable cough.  She is to keep a diary of how often and what triggers produces symptoms.  Also whether the albuterol helps.  I suspect most of her issues are related to asthma more so than 2 COPD per se.  We will see her in follow-up in 6 to 8 weeks time she is to contact us prior to that time should any new difficulties arise.  Renold Don, MD [facility]    *This note was dictated using voice recognition software/Dragon.  Despite best efforts to proofread, errors can occur which can change the meaning. Any transcriptional errors that result from this process are unintentional and may not be fully corrected at the time of dictation.

## 2021-08-09 NOTE — Patient Instructions (Addendum)
We are going to get breathing tests.  We are ordering some blood tests.  I suspect you may have asthma rather than COPD but this will be confirmed with these studies.  We will see you in follow-up in 6 to 8 weeks time call sooner should any new problems arise.  Notification of test results are managed in the following manner: If there are  any recommendations or changes to the  plan of care discussed in office today,  we will contact you and let you know what they are. If you do not hear from [place], then your results are normal and you can view them through your  MyChart account , or a letter will be sent to you. Thank you again for trusting [place] with your care to  [facility].

## 2021-08-10 ENCOUNTER — Ambulatory Visit: Payer: Medicare Other | Attending: Pulmonary Disease

## 2021-08-10 ENCOUNTER — Encounter: Payer: Self-pay | Admitting: Pulmonary Disease

## 2021-08-10 DIAGNOSIS — R059 Cough, unspecified: Secondary | ICD-10-CM | POA: Diagnosis not present

## 2021-08-10 DIAGNOSIS — R0602 Shortness of breath: Secondary | ICD-10-CM | POA: Diagnosis not present

## 2021-08-10 DIAGNOSIS — Z01818 Encounter for other preprocedural examination: Secondary | ICD-10-CM | POA: Insufficient documentation

## 2021-08-10 MED ORDER — ALBUTEROL SULFATE (2.5 MG/3ML) 0.083% IN NEBU
2.5000 mg | INHALATION_SOLUTION | Freq: Once | RESPIRATORY_TRACT | Status: AC
  Administered 2021-08-10: 2.5 mg via RESPIRATORY_TRACT
  Filled 2021-08-10: qty 3

## 2021-08-11 ENCOUNTER — Other Ambulatory Visit: Payer: Self-pay | Admitting: Family

## 2021-08-11 DIAGNOSIS — R195 Other fecal abnormalities: Secondary | ICD-10-CM

## 2021-08-11 LAB — ALLERGEN PANEL (27) + IGE
Alternaria Alternata IgE: <0.1 kU/L
Aspergillus Fumigatus IgE: <0.1 kU/L
Bahia Grass IgE: <0.1 kU/L
Bermuda Grass IgE: <0.1 kU/L
Cat Dander IgE: 2.34 kU/L — AB
Cedar, Mountain IgE: <0.1 kU/L
Cladosporium Herbarum IgE: <0.1 kU/L
Cocklebur IgE: 0.49 kU/L — AB
Cockroach, American IgE: <0.1 kU/L
Common Silver Birch IgE: 1.02 kU/L — AB
D Farinae IgE: 1.65 kU/L — AB
D Pteronyssinus IgE: 1.79 kU/L — AB
Dog Dander IgE: 0.3 kU/L — AB
Elm, American IgE: <0.1 kU/L
Hickory, White IgE: 0.39 kU/L — AB
IgE (Immunoglobulin E), Serum: 81 IU/mL (ref 6–495)
Johnson Grass IgE: <0.1 kU/L
Kentucky Bluegrass IgE: <0.1 kU/L
Maple/Box Elder IgE: <0.1 kU/L
Mucor Racemosus IgE: <0.1 kU/L
Oak, White IgE: 0.46 kU/L — AB
Penicillium Chrysogen IgE: <0.1 kU/L
Pigweed, Rough IgE: <0.1 kU/L
Plantain, English IgE: <0.1 kU/L
Ragweed, Short IgE: 0.82 kU/L — AB
Setomelanomma Rostrat: <0.1 kU/L
Timothy Grass IgE: <0.1 kU/L
White Mulberry IgE: <0.1 kU/L

## 2021-08-11 LAB — COLOGUARD: COLOGUARD: POSITIVE — AB

## 2021-08-15 ENCOUNTER — Encounter: Payer: Self-pay | Admitting: Internal Medicine

## 2021-08-15 NOTE — Progress Notes (Signed)
Cardiology Office Note  Date:  08/17/2021   ID:  Dawn Matthews, DOB Mar 25, 1956, MRN [id number]  PCP:  Lesleigh Noe, MD   Chief Complaint  Patient presents with   New Patient (Initial Visit)    Ref by Dr. Waunita Schooner for an abnormal EKG. Patient c/o chest pain and shortness of breath. Medications reviewed by the patient verbally.     HPI:  [name] is a 66 yo woman with PMH o   Chronic shortness of breath, asthma  followed by pulmonary, concern for COPD Non-smoker Who presents by referral from Dr. 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  PSH:    Past Surgical History:  Procedure Laterality Date   FOOT SURGERY  2010   SKIN CANCER EXCISION      Current Outpatient Medications  Medication Sig Dispense Refill   Multiple Vitamins-Minerals (CENTRUM SILVER PO) Take by mouth.     VITAMIN D, CHOLECALCIFEROL, PO Take by mouth.     albuterol (VENTOLIN HFA) 108 (90 Base) MCG/ACT inhaler Inhale 2 puffs into the lungs every 4 (four) hours as needed for shortness of breath. (Patient not taking: Reported on 08/16/2021) 1 each 0   montelukast (SINGULAIR) 10 MG tablet Take 1 tablet (10 mg total) by mouth at bedtime. (Patient not taking: Reported on 08/16/2021) 30 tablet 2   No current facility-administered medications for this visit.      Allergies:   Penicillins and Codeine   Social History:  The patient  reports that she has never smoked. She has never used smokeless tobacco. She reports current alcohol use. She reports that she does not use drugs.   Family History:   family history includes Atrial fibrillation in her mother; COPD in her mother; Diabetes in her father, maternal grandmother, and mother; Hypertension in her mother and sister; Lung cancer in her sister; Melanoma in her brother and sister.    Review of Systems: Review of Systems  Constitutional: Negative.   HENT: Negative.    Respiratory:  Positive for shortness of breath.   Cardiovascular:  Positive for chest pain.  Gastrointestinal: Negative.   Musculoskeletal: Negative.   Neurological: Negative.   Psychiatric/Behavioral: Negative.    All other systems reviewed and are negative.   PHYSICAL EXAM: VS:  BP 104/68 (BP Location: Right Arm, Patient Position: Sitting, Cuff Size: Normal)    Pulse 62    Ht 5\' 4"  (1.626 m)    Wt 144 lb 6 oz (65.5 kg)    SpO2 98%    BMI 24.78 kg/m  , BMI Body mass index is 24.78 kg/m. GEN: Well nourished, well developed, in no acute distress HEENT: normal Neck: no JVD, carotid bruits, or masses Cardiac: RRR; no murmurs, rubs, or gallops,no edema  Respiratory:  clear to auscultation bilaterally, normal work of breathing GI: soft, nontender, nondistended, + BS MS:  no deformity or atrophy Skin: warm and dry, no rash Neuro:  Strength and sensation are intact Psych: euthymic mood, full affect    Recent Labs: 06/28/2021: ALT 18; BUN 17; Creatinine, Ser 0.78; Hemoglobin 13.6; Platelets 279.0; Potassium 4.3; Sodium 137    Lipid Panel Lab Results  Component Value Date   CHOL 172 12/10/2019   HDL 65.90 12/10/2019   LDLCALC 95 12/10/2019   TRIG 54.0 12/10/2019      Wt Readings from Last 3 Encounters:  08/16/21 144 lb 6 oz (65.5 kg)  08/09/21 148 lb 12.8 oz (67.5 kg)  07/25/21 143 lb (64.9 kg)        ASSESSMENT AND PLAN:  Problem List Items Addressed This Visit     Abnormal finding on EKG   Relevant Orders   EKG 12-Lead   ECHOCARDIOGRAM COMPLETE BUBBLE STUDY   Shortness of breath - Primary   Relevant Orders   EKG 12-Lead   ECHOCARDIOGRAM COMPLETE BUBBLE STUDY   Prediabetes   Relevant Orders   EKG 12-Lead   Other Visit Diagnoses     Chest pain of uncertain etiology          Shortness of breath/chest pain Likely exacerbated after viral syndrome, underlying asthma Unable to definitively exclude cardiac disease Recommended echocardiogram to rule out structural heart disease, pulmonary hypertension, effect from virus Unable to exclude anxiety as a contributor to symptoms  Abnormal EKG Relatively benign-appearing, likely secondary to lead placement  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 Waunita Schooner will be referred back to her office for ongoing care of the issues detailed above  Signed, Esmond Plants, M.D., Ph.D. [place], [place]

## 2021-08-16 ENCOUNTER — Other Ambulatory Visit: Payer: Self-pay

## 2021-08-16 ENCOUNTER — Encounter: Payer: Self-pay | Admitting: Cardiovascular Disease

## 2021-08-16 ENCOUNTER — Ambulatory Visit: Payer: Medicare Other | Admitting: Cardiovascular Disease

## 2021-08-16 VITALS — BP 104/68 | HR 62 | Ht 64.0 in | Wt 144.4 lb

## 2021-08-16 DIAGNOSIS — R0602 Shortness of breath: Secondary | ICD-10-CM | POA: Diagnosis not present

## 2021-08-16 DIAGNOSIS — R7303 Prediabetes: Secondary | ICD-10-CM

## 2021-08-16 DIAGNOSIS — R079 Chest pain, unspecified: Secondary | ICD-10-CM

## 2021-08-16 DIAGNOSIS — R9431 Abnormal electrocardiogram [ECG] [EKG]: Secondary | ICD-10-CM

## 2021-08-16 NOTE — Patient Instructions (Addendum)
Medication Instructions:  No changes  If you need a refill on your cardiac medications before your next appointment, please call your pharmacy.   Lab work: No new labs needed  Testing/Procedures: Your physician has requested that you have an echocardiogram. Echocardiography is a painless test that uses sound waves to create images of your heart. It provides your doctor with information about the size and shape of your heart and how well your hearts chambers and valves are working. This procedure takes approximately one hour. There are no restrictions for this procedure.  There is a possibility that an IV may need to be started during your test to inject an image enhancing agent. This is done to obtain more optimal pictures of your heart. Therefore we ask that you do at least drink some water prior to coming in to hydrate your veins.    Follow-Up: At [facility], you and your health needs are our priority.  As part of our continuing mission to provide you with exceptional heart care, we have created designated Provider Care Teams.  These Care Teams include your primary Cardiologist (physician) and Advanced Practice Providers (APPs -  Physician Assistants and Nurse Practitioners) who all work together to provide you with the care you need, when you need it.  You will need a follow up appointment  as needed  Providers on your designated Care Team:   Murray Hodgkins, NP Christell Faith, PA-C Cadence Kathlen Mody, [place]  COVID-19 Vaccine Information can be found at: ShippingScam.co.uk For questions related to vaccine distribution or appointments, please email vaccine@[place] .com or call [phone].

## 2021-09-05 DIAGNOSIS — D485 Neoplasm of uncertain behavior of skin: Secondary | ICD-10-CM | POA: Diagnosis not present

## 2021-09-05 DIAGNOSIS — L820 Inflamed seborrheic keratosis: Secondary | ICD-10-CM | POA: Diagnosis not present

## 2021-09-05 DIAGNOSIS — D2272 Melanocytic nevi of left lower limb, including hip: Secondary | ICD-10-CM | POA: Diagnosis not present

## 2021-09-05 DIAGNOSIS — Z85828 Personal history of other malignant neoplasm of skin: Secondary | ICD-10-CM | POA: Diagnosis not present

## 2021-09-05 DIAGNOSIS — R208 Other disturbances of skin sensation: Secondary | ICD-10-CM | POA: Diagnosis not present

## 2021-09-05 DIAGNOSIS — L538 Other specified erythematous conditions: Secondary | ICD-10-CM | POA: Diagnosis not present

## 2021-09-05 DIAGNOSIS — C44619 Basal cell carcinoma of skin of left upper limb, including shoulder: Secondary | ICD-10-CM | POA: Diagnosis not present

## 2021-09-05 DIAGNOSIS — D2262 Melanocytic nevi of left upper limb, including shoulder: Secondary | ICD-10-CM | POA: Diagnosis not present

## 2021-09-05 DIAGNOSIS — L570 Actinic keratosis: Secondary | ICD-10-CM | POA: Diagnosis not present

## 2021-09-05 DIAGNOSIS — X32XXXA Exposure to sunlight, initial encounter: Secondary | ICD-10-CM | POA: Diagnosis not present

## 2021-09-05 DIAGNOSIS — D2261 Melanocytic nevi of right upper limb, including shoulder: Secondary | ICD-10-CM | POA: Diagnosis not present

## 2021-09-06 ENCOUNTER — Ambulatory Visit (AMBULATORY_SURGERY_CENTER): Payer: Medicare Other

## 2021-09-06 ENCOUNTER — Other Ambulatory Visit: Payer: Self-pay

## 2021-09-06 VITALS — Ht 64.0 in | Wt 142.0 lb

## 2021-09-06 DIAGNOSIS — Z1211 Encounter for screening for malignant neoplasm of colon: Secondary | ICD-10-CM

## 2021-09-06 MED ORDER — NA SULFATE-K SULFATE-MG SULF 17.5-3.13-1.6 GM/177ML PO SOLN
1.0000 | Freq: Once | ORAL | 0 refills | Status: AC
Start: 2021-09-06 — End: 2021-09-06

## 2021-09-06 NOTE — Progress Notes (Signed)
No egg or soy allergy known to patient  No issues known to pt with past sedation with any surgeries or procedures Patient denies ever being told they had issues or difficulty with intubation  No FH of Malignant Hyperthermia Pt is not on diet pills Pt is not on home 02  Pt is not on blood thinners  Pt denies issues with constipation;  No A fib or A flutter Pt is fully vaccinated for Covid x 2; NO PA's for preps discussed with pt in PV today  Discussed with pt there will be an out-of-pocket cost for prep and that varies from $0 to 70 + dollars - pt verbalized understanding  Due to the COVID-19 pandemic we are asking patients to follow certain guidelines in PV and the [place]   Pt aware of COVID protocols and [facility] guidelines  PV completed over the phone. Pt verified name, DOB, address and insurance during PV today.  Pt mailed instruction packet with copy of consent form to read and not return, and instructions.  Pt encouraged to call with questions or issues.  If pt has My chart, procedure instructions sent via My Chart

## 2021-09-09 ENCOUNTER — Ambulatory Visit (INDEPENDENT_AMBULATORY_CARE_PROVIDER_SITE_OTHER): Payer: Medicare Other

## 2021-09-09 ENCOUNTER — Other Ambulatory Visit: Payer: Self-pay

## 2021-09-09 DIAGNOSIS — R0602 Shortness of breath: Secondary | ICD-10-CM | POA: Diagnosis not present

## 2021-09-09 DIAGNOSIS — R9431 Abnormal electrocardiogram [ECG] [EKG]: Secondary | ICD-10-CM | POA: Diagnosis not present

## 2021-09-09 LAB — ECHOCARDIOGRAM COMPLETE BUBBLE STUDY
AR max vel: 2.57 cm2
AV Area VTI: 2.56 cm2
AV Area mean vel: 2.25 cm2
AV Mean grad: 4 mmHg
AV Peak grad: 6.4 mmHg
Ao pk vel: 1.26 m/s
Area-P 1/2: 2.88 cm2
Calc EF: 72.9 %
S' Lateral: 3.1 cm
Single Plane A2C EF: 77.5 %
Single Plane A4C EF: 68 %

## 2021-09-12 ENCOUNTER — Telehealth: Payer: Self-pay

## 2021-09-12 ENCOUNTER — Telehealth: Payer: Self-pay | Admitting: *Deleted

## 2021-09-12 DIAGNOSIS — Z79899 Other long term (current) drug therapy: Secondary | ICD-10-CM

## 2021-09-12 MED ORDER — POTASSIUM CHLORIDE ER 10 MEQ PO TBCR: 10.0000 meq | EXTENDED_RELEASE_TABLET | Freq: Every day | ORAL | 3 refills | Status: DC

## 2021-09-12 MED ORDER — FUROSEMIDE 20 MG PO TABS: 20.0000 mg | ORAL_TABLET | Freq: Every day | ORAL | 3 refills | Status: DC

## 2021-09-12 NOTE — Telephone Encounter (Signed)
Dr Lorenso Courier,  Pt had a PV 09-06-21- she has a colon scheduled 2-13 with you- she saw cardio for shortness of breath- she had an Echo 2-3 dr Rockey Situ- EF 60-65 %%, no AVS- this is his note  Echocardiogram Normal LV function, normal RV function Moderately elevated right heart pressures, leaky tricuspid valve The above could be contributing to shortness of breath Would recommend restart Lasix 20 mg daily, potassium 10 mill equivalents daily Moderate salt and fluid intake, we will try to get pressures down, this may help breathing After been on Lasix daily, check BMP in 2 to 3 weeks Thx TG  OK to proceed with colon as scheduled 2-13?  Please advise- Thanks Lelan Pons PV

## 2021-09-12 NOTE — Telephone Encounter (Signed)
Able to reach pt regarding her recent ECHO Dr. Rockey Situ had a chance to review her results and advised   "Echocardiogram  Normal LV function, normal RV function  Moderately elevated right heart pressures, leaky tricuspid valve  The above could be contributing to shortness of breath  Would recommend restart Lasix 20 mg daily, potassium 10 mill equivalents daily  Moderate salt and fluid intake, we will try to get pressures down, this may help breathing  After been on Lasix daily, check BMP in 2 to 3 weeks  Thx  TG "  Dawn Matthews thankful for reaching back out to her to review results further in details, she will pick up newly order medications at [facility], understands med instructions, appt 2/27 at 08:00 am for repeat BMP, otherwise all questions were address and no additional concerns at this time. Agreeable to plan, will call back for anything further.

## 2021-09-12 NOTE — Telephone Encounter (Signed)
Spoke with Mrs. Curd regarding ECHO results, she has seen on MyChart with Dr. Donivan Scull comments, but pt would like a call back to discuss further as she is out walking her dog. Advised will call back to review more in detail.

## 2021-09-15 NOTE — Telephone Encounter (Signed)
Patient called back this morning stating she needed to speak with someone regarding whether or not she should go ahead with the procedure as she will need to pick up her prep. Please call patient and advise.  Thank you.

## 2021-09-15 NOTE — Telephone Encounter (Signed)
Called and spoke to the patient. Patient states that her SOB is now back at her baseline. She feels like she had a respiratory infection that had temporarily made her more SOB. I reviewed her recent TTE results and think that it would still be appropriate to proceed.

## 2021-09-15 NOTE — Telephone Encounter (Signed)
Pt instructed to pick up prep and I will find out what  and how we need to proceed for her procedure Monday 2-13

## 2021-09-16 ENCOUNTER — Ambulatory Visit
Admission: RE | Admit: 2021-09-16 | Discharge: 2021-09-16 | Disposition: A | Discharge status: Home / Self Care | Payer: Medicare Other | Source: Ambulatory Visit | Attending: Family Medicine | Admitting: Family Medicine

## 2021-09-16 ENCOUNTER — Encounter: Payer: Self-pay | Admitting: Internal Medicine

## 2021-09-16 ENCOUNTER — Other Ambulatory Visit: Payer: Self-pay

## 2021-09-16 DIAGNOSIS — Z1231 Encounter for screening mammogram for malignant neoplasm of breast: Secondary | ICD-10-CM | POA: Insufficient documentation

## 2021-09-19 ENCOUNTER — Ambulatory Visit (AMBULATORY_SURGERY_CENTER): Payer: Medicare Other | Admitting: Internal Medicine

## 2021-09-19 ENCOUNTER — Encounter: Payer: Self-pay | Admitting: Internal Medicine

## 2021-09-19 VITALS — BP 129/72 | HR 56 | Temp 98.6°F | Resp 15 | Ht 64.0 in | Wt 142.0 lb

## 2021-09-19 DIAGNOSIS — K635 Polyp of colon: Secondary | ICD-10-CM | POA: Diagnosis not present

## 2021-09-19 DIAGNOSIS — R195 Other fecal abnormalities: Secondary | ICD-10-CM | POA: Diagnosis not present

## 2021-09-19 DIAGNOSIS — Z1211 Encounter for screening for malignant neoplasm of colon: Secondary | ICD-10-CM | POA: Diagnosis not present

## 2021-09-19 DIAGNOSIS — D125 Benign neoplasm of sigmoid colon: Secondary | ICD-10-CM

## 2021-09-19 DIAGNOSIS — D120 Benign neoplasm of cecum: Secondary | ICD-10-CM | POA: Diagnosis not present

## 2021-09-19 DIAGNOSIS — D122 Benign neoplasm of ascending colon: Secondary | ICD-10-CM | POA: Diagnosis not present

## 2021-09-19 HISTORY — PX: COLONOSCOPY: SHX174

## 2021-09-19 MED ORDER — SODIUM CHLORIDE 0.9 % IV SOLN: 500.0000 mL | Freq: Once | INTRAVENOUS | Status: DC

## 2021-09-19 NOTE — Progress Notes (Signed)
Called to room to assist during endoscopic procedure.  Patient ID and intended procedure confirmed with present staff. Received instructions for my participation in the procedure from the performing physician.  

## 2021-09-19 NOTE — Op Note (Addendum)
[place] Patient Name: Dawn Matthews Procedure Date: 09/19/2021 11:11 AM MRN: [id number] Endoscopist: Sonny Masters "Dawn Matthews ,  Age: 66 Referring MD:  Date of Birth: Aug 20, 1955 Gender: Female Account #: [id number] Procedure:                Colonoscopy Indications:              This is the patient's first colonoscopy, Positive                            Cologuard test Medicines:                Monitored Anesthesia Care Procedure:                Pre-Anesthesia Assessment:                           - Prior to the procedure, a History and Physical                            was performed, and patient medications and                            allergies were reviewed. The patient's tolerance of                            previous anesthesia was also reviewed. The risks                            and benefits of the procedure and the sedation                            options and risks were discussed with the patient.                            All questions were answered, and informed consent                            was obtained. Prior Anticoagulants: The patient has                            taken no previous anticoagulant or antiplatelet                            agents. ASA Grade Assessment: II - A patient with                            mild systemic disease. After reviewing the risks                            and benefits, the patient was deemed in                            satisfactory condition to undergo the procedure.  After obtaining informed consent, the colonoscope                            was passed under direct vision. Throughout the                            procedure, the patient's blood pressure, pulse, and                            oxygen saturations were monitored continuously. The                            CF [id number] #[id number] was introduced through the anus                            and advanced to the the terminal  ileum. The                            colonoscopy was performed without difficulty. The                            patient tolerated the procedure well. The quality                            of the bowel preparation was good. The terminal                            ileum, ileocecal valve, appendiceal orifice, and                            rectum were photographed. Scope In: 11:23:14 AM Scope Out: 11:59:01 AM Scope Withdrawal Time: 0 hours 23 minutes 39 seconds  Total Procedure Duration: 0 hours 35 minutes 47 seconds  Findings:                 The terminal ileum appeared normal.                           A 20 mm polyp was found in the cecum. The polyp was                            sessile. Preparations were made for mucosal                            resection. Saline was injected to raise the lesion.                            Snare mucosal resection was performed piecemeal.                            Resection and retrieval were complete.                           A 4 mm polyp was found in the ascending colon.  The                            polyp was sessile. The polyp was removed with a                            cold snare. Resection and retrieval were complete.                           Two sessile polyps were found in the sigmoid colon.                            The polyps were 3 to 4 mm in size. These polyps                            were removed with a cold snare. Resection and                            retrieval were complete.                           Non-bleeding internal hemorrhoids were found during                            retroflexion. Complications:            No immediate complications. Estimated Blood Loss:     Estimated blood loss was minimal. Impression:               - The examined portion of the ileum was normal.                           - One 20 mm polyp in the cecum, removed with                            mucosal resection. Resected and retrieved.                            - One 4 mm polyp in the ascending colon, removed                            with a cold snare. Resected and retrieved.                           - Two 3 to 4 mm polyps in the sigmoid colon,                            removed with a cold snare. Resected and retrieved.                           - Non-bleeding internal hemorrhoids.                           - Mucosal resection was performed. Resection and  retrieval were complete. Recommendation:           - Discharge patient to home (with escort).                           - Await pathology results.                           - Repeat colonoscopy in 6 months to review the                            polypectomy site.                           - The findings and recommendations were discussed                            with the patient. Sonny Masters "Dawn Matthews,  09/19/2021 12:07:53 PM

## 2021-09-19 NOTE — Patient Instructions (Signed)
Will repeat Colonoscopy in 6 months ( should get a reminder in the mail to make this appointment )  Handouts on polyps & hemorrhoids given to you today  Await pathology results on polyps removed     YOU HAD AN ENDOSCOPIC PROCEDURE TODAY AT [place]:   Refer to the procedure report that was given to you for any specific questions about what was found during the examination.  If the procedure report does not answer your questions, please call your gastroenterologist to clarify.  If you requested that your care partner not be given the details of your procedure findings, then the procedure report has been included in a sealed envelope for you to review at your convenience later.  YOU SHOULD EXPECT: Some feelings of bloating in the abdomen. Passage of more gas than usual.  Walking can help get rid of the air that was put into your GI tract during the procedure and reduce the bloating. If you had a lower endoscopy (such as a colonoscopy or flexible sigmoidoscopy) you may notice spotting of blood in your stool or on the toilet paper. If you underwent a bowel prep for your procedure, you may not have a normal bowel movement for a few days.  Please Note:  You might notice some irritation and congestion in your nose or some drainage.  This is from the oxygen used during your procedure.  There is no need for concern and it should clear up in a day or so.  SYMPTOMS TO REPORT IMMEDIATELY:  Following lower endoscopy (colonoscopy or flexible sigmoidoscopy):  Excessive amounts of blood in the stool  Significant tenderness or worsening of abdominal pains  Swelling of the abdomen that is new, acute  Fever of 100F or higher   For urgent or emergent issues, a gastroenterologist can be reached at any hour by calling [phone]. Do not use MyChart messaging for urgent concerns.    DIET:  We do recommend a small meal at first, but then you may proceed to your regular diet.  Drink  plenty of fluids but you should avoid alcoholic beverages for 24 hours.  ACTIVITY:  You should plan to take it easy for the rest of today and you should NOT DRIVE or use heavy machinery until tomorrow (because of the sedation medicines used during the test).    FOLLOW UP: Our staff will call the number listed on your records 48-72 hours following your procedure to check on you and address any questions or concerns that you may have regarding the information given to you following your procedure. If we do not reach you, we will leave a message.  We will attempt to reach you two times.  During this call, we will ask if you have developed any symptoms of COVID 19. If you develop any symptoms (ie: fever, flu-like symptoms, shortness of breath, cough etc.) before then, please call [phone].  If you test positive for Covid 19 in the 2 weeks post procedure, please call and report this information to [place].    If any biopsies were taken you will be contacted by phone or by letter within the next 1-3 weeks.  Please call us at [phone] if you have not heard about the biopsies in 3 weeks.    SIGNATURES/CONFIDENTIALITY: You and/or your care partner have signed paperwork which will be entered into your electronic medical record.  These signatures attest to the fact that that the information above on your After Visit Summary  has been reviewed and is understood.  Full responsibility of the confidentiality of this discharge information lies with you and/or your care-partner.

## 2021-09-19 NOTE — Progress Notes (Signed)
GASTROENTEROLOGY PROCEDURE H&P NOTE   Primary Care Physician: Lesleigh Noe, MD    Reason for Procedure:   Positive Cologuard  Plan:    Colonoscopy  Patient is appropriate for endoscopic procedure(s) in the ambulatory ([place]) setting.  The nature of the procedure, as well as the risks, benefits, and alternatives were carefully and thoroughly reviewed with the patient. Ample time for discussion and questions allowed. The patient understood, was satisfied, and agreed to proceed.     HPI: Dawn Matthews is a 66 y.o. female who presents for colonoscopy for a positive Cologuard. Denies blood in stools, changes in bowel habits, weight loss. Denies fam hx of colon cancer.  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 Authorizing Provider  furosemide (LASIX) 20 MG tablet Take 1 tablet (20 mg total) by mouth daily. 09/12/21  Yes Gollan, Kathlene November, MD  Multiple Vitamins-Minerals (CENTRUM SILVER PO) Take 1 tablet by mouth daily at 6 (six) AM.   Yes [provider]  potassium chloride (KLOR-CON) 10 MEQ tablet Take 1 tablet (10 mEq total) by mouth daily. Take with lasix 09/12/21  Yes Gollan, Kathlene November, MD  VITAMIN D, CHOLECALCIFEROL, PO Take 1 tablet by mouth daily at 6 (six) AM.   Yes [provider]  albuterol (VENTOLIN HFA) 108 (90 Base) MCG/ACT inhaler Inhale 2 puffs into the lungs every 4 (four) hours as needed for shortness of breath. Patient not taking: Reported on 09/19/2021 07/25/21   [name], FNP    Current Outpatient Medications   Medication Sig Dispense Refill   furosemide (LASIX) 20 MG tablet Take 1 tablet (20 mg total) by mouth daily. 90 tablet 3   Multiple Vitamins-Minerals (CENTRUM SILVER PO) Take 1 tablet by mouth daily at 6 (six) AM.     potassium chloride (KLOR-CON) 10 MEQ tablet Take 1 tablet (10 mEq total) by mouth daily. Take with lasix 90 tablet 3   VITAMIN D, CHOLECALCIFEROL, PO Take 1 tablet by mouth daily at 6 (six) AM.     albuterol (VENTOLIN HFA) 108 (90 Base) MCG/ACT inhaler Inhale 2 puffs into the lungs every 4 (four) hours as needed for shortness of breath. (Patient not taking: Reported on 09/19/2021) 1 each 0   Current Facility-Administered Medications  Medication Dose Route Frequency Provider Last Rate Last Admin   0.9 %  sodium chloride infusion  500 mL Intravenous Once Sharyn Creamer, MD        Allergies as of 09/19/2021 - Review Complete 09/19/2021  Allergen Reaction Noted   Penicillins Rash 10/14/2020   Codeine  12/19/2018    Family History  Problem Relation Age of Onset   Diabetes Mother    COPD Mother    Hypertension Mother    Atrial fibrillation Mother    Diabetes Father    Melanoma Sister    Hypertension Sister    Lung cancer Sister        smoker   Melanoma  Brother    Diabetes Maternal Grandmother    Breast cancer Neg Hx    Colon polyps Neg Hx    Colon cancer Neg Hx    Esophageal cancer Neg Hx    Rectal cancer Neg Hx    Stomach cancer Neg Hx     Social History   Socioeconomic History   Marital status: Single    Spouse name: Not on file   Number of children: 0   Years of education: College   Highest education level: Not on file  Occupational History   Not on file  Tobacco Use   Smoking status: Never   Smokeless tobacco: Never  Vaping Use   Vaping Use: Never used  Substance and Sexual Activity   Alcohol use: Yes    Alcohol/week: 3.0 standard drinks    Types: 3 Standard drinks or equivalent per week   Drug use: Never   Sexual activity: Not Currently  Other  Topics Concern   Not on file  Social History Narrative   12/10/19   From: [place], [place] - moved to [facility] in 2019 to be near family   Living: alone    Pet: dog - german shepherd   Work: retired from [employer] and [employer]      Family: sister nearby, but does have family in [place] still      Enjoys: walking, visiting small towns, crossword puzzles, drink coffee      Exercise: walking 3-4 miles per day   Diet: likes ice cream eater, trying to eat healthier now, usually skips lunch and eats early dinner      Safety   Seat belts: Yes    Guns: No   Safe in relationships: Yes    Social Determinants of Radio broadcast assistant Strain: Not on file  Food Insecurity: Not on file  Transportation Needs: Not on file  Physical Activity: Not on file  Stress: Not on file  Social Connections: Not on file  Intimate Partner Violence: Not on file    Physical Exam: Vital signs in last 24 hours: BP 136/84    Pulse 73    Temp 98.6 F (37 C)    Ht 5\' 4"  (1.626 m)    Wt 142 lb (64.4 kg)    SpO2 98%    BMI 24.37 kg/m  GEN: NAD EYE: Sclerae anicteric ENT: MMM CV: Non-tachycardic Pulm: No increased work of breathing GI: Soft, NT/ND NEURO:  Alert & Oriented   [name], MD [facility]  09/19/2021 11:12 AM

## 2021-09-19 NOTE — Progress Notes (Signed)
Sedate, gd SR, tolerated procedure well, VSS, report to RN 

## 2021-09-20 ENCOUNTER — Telehealth: Payer: Self-pay

## 2021-09-20 NOTE — Telephone Encounter (Signed)
Per Dr. Lorenso Courier, edit required to 09/19/21 colonoscopy report for repeat colon in 6 months rather than 1 yr.. Recall placed per her request.

## 2021-09-21 ENCOUNTER — Telehealth: Payer: Self-pay

## 2021-09-21 ENCOUNTER — Encounter: Payer: Self-pay | Admitting: Internal Medicine

## 2021-09-21 ENCOUNTER — Ambulatory Visit: Payer: Medicare Other | Admitting: Pulmonary Disease

## 2021-09-21 NOTE — Telephone Encounter (Signed)
°  Follow up Call-  Call back number 09/19/2021  Post procedure Call Back phone  # [phone]  Permission to leave phone message Yes  Some recent data might be hidden     Patient questions:  Do you have a fever, pain , or abdominal swelling? No. Pain Score  0 *  Have you tolerated food without any problems? Yes.    Have you been able to return to your normal activities? Yes.    Do you have any questions about your discharge instructions: Diet   No. Medications  No. Follow up visit  No.  Do you have questions or concerns about your Care? No.  Actions: * If pain score is 4 or above: No action needed, pain <4.  Have you developed a fever since your procedure? no  2.   Have you had an respiratory symptoms (SOB or cough) since your procedure? no  3.   Have you tested positive for COVID 19 since your procedure no  4.   Have you had any family members/close contacts diagnosed with the COVID 19 since your procedure?  no   If yes to any of these questions please route to Joylene John, RN and Joella Prince, RN

## 2021-09-26 DIAGNOSIS — L905 Scar conditions and fibrosis of skin: Secondary | ICD-10-CM | POA: Diagnosis not present

## 2021-09-26 DIAGNOSIS — C44619 Basal cell carcinoma of skin of left upper limb, including shoulder: Secondary | ICD-10-CM | POA: Diagnosis not present

## 2021-10-03 ENCOUNTER — Other Ambulatory Visit: Payer: Self-pay

## 2021-10-03 ENCOUNTER — Other Ambulatory Visit (INDEPENDENT_AMBULATORY_CARE_PROVIDER_SITE_OTHER): Payer: Medicare Other

## 2021-10-03 DIAGNOSIS — Z79899 Other long term (current) drug therapy: Secondary | ICD-10-CM | POA: Diagnosis not present

## 2021-10-04 LAB — BASIC METABOLIC PANEL
BUN/Creatinine Ratio: 21 (ref 12–28)
BUN: 16 mg/dL (ref 8–27)
CO2: 28 mmol/L (ref 20–29)
Calcium: 9.7 mg/dL (ref 8.7–10.3)
Chloride: 102 mmol/L (ref 96–106)
Creatinine, Ser: 0.78 mg/dL (ref 0.57–1.00)
Glucose: 95 mg/dL (ref 70–99)
Potassium: 4.6 mmol/L (ref 3.5–5.2)
Sodium: 141 mmol/L (ref 134–144)
eGFR: 84 mL/min/{1.73_m2} (ref >59)

## 2021-10-10 ENCOUNTER — Other Ambulatory Visit: Payer: Self-pay

## 2021-10-10 ENCOUNTER — Encounter: Payer: Self-pay | Admitting: Family Medicine

## 2021-10-10 ENCOUNTER — Ambulatory Visit (INDEPENDENT_AMBULATORY_CARE_PROVIDER_SITE_OTHER): Payer: Medicare Other | Admitting: Family Medicine

## 2021-10-10 VITALS — BP 118/62 | HR 65 | Temp 98.6°F | Ht 64.0 in | Wt 146.0 lb

## 2021-10-10 DIAGNOSIS — M8580 Other specified disorders of bone density and structure, unspecified site: Secondary | ICD-10-CM | POA: Diagnosis not present

## 2021-10-10 DIAGNOSIS — C44319 Basal cell carcinoma of skin of other parts of face: Secondary | ICD-10-CM | POA: Diagnosis not present

## 2021-10-10 DIAGNOSIS — I071 Rheumatic tricuspid insufficiency: Secondary | ICD-10-CM | POA: Diagnosis not present

## 2021-10-10 NOTE — Patient Instructions (Signed)
When you schedule your next mammogram schedule your dexa the same day ? ?Call cardiology to see when you should come back next ? ?Continue to take the lasix to help with breathing ?

## 2021-10-10 NOTE — Progress Notes (Signed)
? ?Subjective:  ? ?  ?Dawn Matthews is a 66 y.o. female presenting for Follow-up (Follow up after cardiology ) ?  ? ? ?HPI ? ?#Tricuspid valve leak - on lasix  ?- feels she is sleeping better ?- has not been urinating as often as they said she should ?- feels she is overall breathing better ? ?#breathing ?- inhaler was helping with this ?- the spirometry was normal  ?- breathing is improved ?- had several 2 rounds of steroids ? ?#positive cologuard ?- returning in 6 months ? ?Hx of basel cell ?- using chemo cream on her face ?- had one removed ?- Dr. Maudie Mercury for dermatology ? ?Review of Systems ? ? ?Social History  ? ?Tobacco Use  ?Smoking Status Never  ?Smokeless Tobacco Never  ? ? ? ?   ?Objective:  ?  ?BP Readings from Last 3 Encounters:  ?10/10/21 118/62  ?09/19/21 129/72  ?08/16/21 104/68  ? ?Wt Readings from Last 3 Encounters:  ?10/10/21 146 lb (66.2 kg)  ?09/19/21 142 lb (64.4 kg)  ?09/06/21 142 lb (64.4 kg)  ? ? ?BP 118/62   Pulse 65   Temp 98.6 ?F (37 ?C) (Temporal)   Ht '5\' 4"'$  (1.626 m)   Wt 146 lb (66.2 kg)   SpO2 98%   BMI 25.06 kg/m?  ? ? ?Physical Exam ?Constitutional:   ?   General: She is not in acute distress. ?   Appearance: She is well-developed. She is not diaphoretic.  ?HENT:  ?   Right Ear: External ear normal.  ?   Left Ear: External ear normal.  ?   Nose: Nose normal.  ?Eyes:  ?   Conjunctiva/sclera: Conjunctivae normal.  ?Cardiovascular:  ?   Rate and Rhythm: Normal rate and regular rhythm.  ?   Heart sounds: No murmur heard. ?Pulmonary:  ?   Effort: Pulmonary effort is normal. No respiratory distress.  ?   Breath sounds: Normal breath sounds. No wheezing or rales.  ?Musculoskeletal:  ?   Cervical back: Neck supple.  ?Skin: ?   General: Skin is warm and dry.  ?   Capillary Refill: Capillary refill takes less than 2 seconds.  ?   Comments: Large healing scar on the left forearm  ?Neurological:  ?   Mental Status: She is alert. Mental status is at baseline.  ?Psychiatric:     ?   Mood  and Affect: Mood normal.     ?   Behavior: Behavior normal.  ? ? ? ? ? ?   ?Assessment & Plan:  ? ?Problem List Items Addressed This Visit   ? ?  ? Cardiovascular and Mediastinum  ? Tricuspid regurgitation  ?  Reviewed recent cardiology note, BMP, and echo.  She is on Lasix and potassium with appropriate electrolytes.  She reports she is feeling better, discussed and advised checking in with cardiology for follow-up and continuing Lasix 20 mg at this time. ?  ?  ?  ? Musculoskeletal and Integument  ? Osteopenia - Primary  ?  DEXA ordered to get next year with mammogram.  Continue vitamin D and regular exercise. ?  ?  ? Relevant Orders  ? DG Bone Density  ? Basal cell carcinoma  ?  Recently had basal cell removed on left arm, is using chemo cream on her face.  She is following closely with dermatology and using sun protection. ?  ?  ? ?I spent >25 minutes with pt , obtaining history, examining, reviewing chart, documenting  encounter and discussing the above plan of care. ? ? ?Return in about 8 months (around 06/12/2022) for medicare wellness. ? ?Lesleigh Noe, MD ? ?This visit occurred during the SARS-CoV-2 public health emergency.  Safety protocols were in place, including screening questions prior to the visit, additional usage of staff PPE, and extensive cleaning of exam room while observing appropriate contact time as indicated for disinfecting solutions.  ? ?

## 2021-10-10 NOTE — Assessment & Plan Note (Signed)
Recently had basal cell removed on left arm, is using chemo cream on her face.  She is following closely with dermatology and using sun protection. ?

## 2021-10-10 NOTE — Assessment & Plan Note (Signed)
Reviewed recent cardiology note, BMP, and echo.  She is on Lasix and potassium with appropriate electrolytes.  She reports she is feeling better, discussed and advised checking in with cardiology for follow-up and continuing Lasix 20 mg at this time. ?

## 2021-10-10 NOTE — Assessment & Plan Note (Addendum)
DEXA ordered to get next year with mammogram.  Continue vitamin D and regular exercise. ?

## 2021-10-31 ENCOUNTER — Telehealth: Payer: Self-pay | Admitting: Cardiovascular Disease

## 2021-10-31 NOTE — Telephone Encounter (Signed)
? ?  Patient Name: Dawn Matthews  ?DOB: 08-Mar-1956 ?MRN: [id number] ? ?Primary Cardiologist: Dr. Rockey Situ ? ?Chart reviewed as part of pre-operative protocol coverage.  ? ?Dental fillings are considered low risk procedures per guidelines and generally do not require any specific cardiac clearance. It is also generally accepted that for simple extractions and dental cleanings, there is no need to interrupt blood thinner therapy. (We do not presently have any blood thinner therapy listed on this patient's medication record.) SBE prophylaxis is not required for the patient from a cardiac standpoint based on known cardiac history. ? ?I will route this recommendation to the requesting party via Epic fax function and remove from pre-op pool. ? ?Please call with questions. ? ?Charlie Pitter, PA-C ?10/31/2021, 12:49 PM ? ?

## 2021-10-31 NOTE — Telephone Encounter (Signed)
? ?  Pre-operative Risk Assessment  ?  ?Patient Name: Dawn Matthews  ?DOB: [birth year] ?MRN: [id number]  ? ? ? ?Request for Surgical Clearance   ? ?Procedure:  Filling ? ?Date of Surgery:  Clearance TBD                              ?   ?Surgeon:  Dr. Martinique Thomas ?Surgeon's Group or Practice Name:  [facility] ?Phone number:  [phone] ?Fax number:  [phone] ?  ?Type of Clearance Requested:   ?- Medical  ?  ?Type of Anesthesia:  Local  ?  ?Additional requests/questions:   ? ?Signed, ?Pilar A Ham   ?10/31/2021, 11:24 AM   ?

## 2021-12-27 ENCOUNTER — Encounter: Payer: Self-pay | Admitting: Family

## 2021-12-27 ENCOUNTER — Ambulatory Visit (INDEPENDENT_AMBULATORY_CARE_PROVIDER_SITE_OTHER)
Admission: RE | Admit: 2021-12-27 | Discharge: 2021-12-27 | Disposition: A | Discharge status: Home / Self Care | Payer: Medicare Other | Source: Ambulatory Visit | Attending: Family | Admitting: Family

## 2021-12-27 ENCOUNTER — Ambulatory Visit (INDEPENDENT_AMBULATORY_CARE_PROVIDER_SITE_OTHER): Payer: Medicare Other | Admitting: Family

## 2021-12-27 VITALS — BP 108/66 | HR 66 | Temp 98.0°F | Resp 16 | Ht 64.0 in | Wt 144.4 lb

## 2021-12-27 DIAGNOSIS — Z79899 Other long term (current) drug therapy: Secondary | ICD-10-CM | POA: Diagnosis not present

## 2021-12-27 DIAGNOSIS — R413 Other amnesia: Secondary | ICD-10-CM | POA: Diagnosis not present

## 2021-12-27 DIAGNOSIS — R29898 Other symptoms and signs involving the musculoskeletal system: Secondary | ICD-10-CM

## 2021-12-27 DIAGNOSIS — M6281 Muscle weakness (generalized): Secondary | ICD-10-CM | POA: Diagnosis not present

## 2021-12-27 DIAGNOSIS — M2550 Pain in unspecified joint: Secondary | ICD-10-CM | POA: Diagnosis not present

## 2021-12-27 DIAGNOSIS — L03011 Cellulitis of right finger: Secondary | ICD-10-CM

## 2021-12-27 DIAGNOSIS — M542 Cervicalgia: Secondary | ICD-10-CM | POA: Diagnosis not present

## 2021-12-27 DIAGNOSIS — R5382 Chronic fatigue, unspecified: Secondary | ICD-10-CM

## 2021-12-27 DIAGNOSIS — M50323 Other cervical disc degeneration at C6-C7 level: Secondary | ICD-10-CM | POA: Diagnosis not present

## 2021-12-27 LAB — B12 AND FOLATE PANEL
Folate: >24.2 ng/mL (ref >5.9)
Vitamin B-12: 634 pg/mL (ref 211–911)

## 2021-12-27 LAB — CBC WITH DIFFERENTIAL/PLATELET
Basophils Absolute: 0 10*3/uL (ref 0.0–0.1)
Basophils Relative: 0.6 % (ref 0.0–3.0)
Eosinophils Absolute: 0.1 10*3/uL (ref 0.0–0.7)
Eosinophils Relative: 1.5 % (ref 0.0–5.0)
HCT: 41.4 % (ref 36.0–46.0)
Hemoglobin: 13.9 g/dL (ref 12.0–15.0)
Lymphocytes Relative: 40 % (ref 12.0–46.0)
Lymphs Abs: 1.9 10*3/uL (ref 0.7–4.0)
MCHC: 33.5 g/dL (ref 30.0–36.0)
MCV: 89.1 fl (ref 78.0–100.0)
Monocytes Absolute: 0.4 10*3/uL (ref 0.1–1.0)
Monocytes Relative: 7.5 % (ref 3.0–12.0)
Neutro Abs: 2.4 10*3/uL (ref 1.4–7.7)
Neutrophils Relative %: 50.4 % (ref 43.0–77.0)
Platelets: 313 10*3/uL (ref 150.0–400.0)
RBC: 4.65 Mil/uL (ref 3.87–5.11)
RDW: 13 % (ref 11.5–15.5)
WBC: 4.8 10*3/uL (ref 4.0–10.5)

## 2021-12-27 LAB — CK: Total CK: 158 U/L (ref 7–177)

## 2021-12-27 LAB — BASIC METABOLIC PANEL
BUN: 14 mg/dL (ref 6–23)
CO2: 30 mEq/L (ref 19–32)
Calcium: 9.6 mg/dL (ref 8.4–10.5)
Chloride: 100 mEq/L (ref 96–112)
Creatinine, Ser: 0.82 mg/dL (ref 0.40–1.20)
GFR: 74.74 mL/min (ref >60.00)
Glucose, Bld: 93 mg/dL (ref 70–99)
Potassium: 4.4 mEq/L (ref 3.5–5.1)
Sodium: 138 mEq/L (ref 135–145)

## 2021-12-27 LAB — TSH: TSH: 1.35 u[IU]/mL (ref 0.35–5.50)

## 2021-12-27 LAB — SEDIMENTATION RATE: Sed Rate: 21 mm/hr (ref 0–30)

## 2021-12-27 MED ORDER — DOXYCYCLINE HYCLATE 100 MG PO TABS
100.0000 mg | ORAL_TABLET | Freq: Two times a day (BID) | ORAL | 0 refills | Status: AC
Start: 2021-12-27 — End: 2022-01-03

## 2021-12-27 NOTE — Patient Instructions (Addendum)
Stop by the lab prior to leaving today. I will notify you of your results once received.   A referral was placed today for neurology.  Please let us know if you have not heard back within 2 weeks about the referral.  Complete xray(s) prior to leaving today. I will notify you of your results once received.   Due to recent changes in healthcare laws, you may see results of your imaging and/or laboratory studies on MyChart before I have had a chance to review them.  I understand that in some cases there may be results that are confusing or concerning to you. Please understand that not all results are received at the same time and often I may need to interpret multiple results in order to provide you with the best plan of care or course of treatment. Therefore, I ask that you please give me 2 business days to thoroughly review all your results before contacting my office for clarification. Should we see a critical lab result, you will be contacted sooner.   It was a pleasure seeing you today! Please do not hesitate to reach out with any questions and or concerns.  Regards,   [name] FNP-C

## 2021-12-27 NOTE — Progress Notes (Signed)
 Established Patient Office Visit  Subjective:  Patient ID: Dawn Matthews, female    DOB: 03/20/1956  Age: 66 y.o. MRN: [id number]  CC:  Chief Complaint  Patient presents with  . Hand Pain    Right ring finger x 1 month    HPI Dawn Matthews is here today with concerns.   When walking last week while walking felt some heaviness in her leg as if it wasn't there (the leg was missing) and felt like her leg was going to give out and or 'not work'. Has happened twice over the past two weeks. Doesn't note a foot drop. Doesn't necessarily feel it in her knee. Otherwise no pain in the leg at current. Last episode was last week and once a week prior. Lasted for a few seconds at each episode, just a very 'strange feeling' no swelling has been noticed. May have felt numbness. No low back pain. Didn't feel electrical.   + cologuard did have colonoscopy 09/19/21 multiple polpys, one being 20 mm polpyp. Repeat due in six months due to size of polyp.seeing GI.   Saw cardiologist, Echocardiogram-normal LV function and RV function, leaky tricuspid valve, recommend to start lasix 20 mg daily potassium 10 meq daily  Sob did improve with lasix,. Only needed albuterol very limited.   Does find at times with forgetfulness and can easily get confused at times.  She has noted this for some time, never been evaluated.   She does also notice creaking/cracking in her neck, not necessarily worse with movement. Doesn't always have to be doing anything in particular but will catch her off guard  and have a crepitus sensation.   Past Medical History:  Diagnosis Date  . Anxiety    situational anx-no meds at this time (09/06/2021)  . Basal cell carcinoma   . Hyperglycemia   . Osteopenia   . 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 DILATION AND CURETTAGE OF UTERUS    . FOOT SURGERY  2010  . SKIN CANCER EXCISION     basal cell  . WISDOM  TOOTH EXTRACTION      Family History  Problem Relation Age of Onset  . Diabetes Mother   . COPD Mother   . Hypertension Mother   . Atrial fibrillation Mother   . Diabetes Father   . Melanoma Sister   . Hypertension Sister   . Lung cancer Sister        smoker  . Melanoma Brother   . Diabetes Maternal Grandmother   . Breast cancer Neg Hx   . Colon polyps Neg Hx   . Colon cancer Neg Hx   . Esophageal cancer Neg Hx   . Rectal cancer Neg Hx   . Stomach cancer Neg Hx     Social History   Socioeconomic History  . Marital status: Single    Spouse name: Not on file  . Number of children: 0  . Years of education: College  . Highest education level: Not on file  Occupational History  . Not on file  Tobacco Use  . Smoking status: Never  . Smokeless tobacco: Never  Vaping Use  . Vaping Use: Never used  Substance and Sexual Activity  . Alcohol use: Yes    Alcohol/week: 3.0 standard drinks    Types: 3 Standard drinks or equivalent per week  . Drug use: Never  . Sexual activity: Not Currently  Other   Topics Concern  . Not on file  Social History Narrative   12/10/19   From: [place] - moved to [facility] in 2019 to be near family   Living: alone    Pet: dog - german shepherd   Work: retired from [employer] and [employer]      Family: sister nearby, but does have family in [place] still      Enjoys: walking, visiting small towns, crossword puzzles, drink coffee      Exercise: walking 3-4 miles per day   Diet: likes ice cream eater, trying to eat healthier now, usually skips lunch and eats early dinner      Safety   Seat belts: Yes    Guns: No   Safe in relationships: Yes    Social Determinants of Radio broadcast assistant Strain: Not on file  Food Insecurity: Not on file  Transportation Needs: Not on file  Physical Activity: Not on file  Stress: Not on file  Social Connections: Not on file  Intimate Partner Violence: Not on file    Outpatient  Medications Prior to Visit  Medication Sig Dispense Refill  . albuterol (VENTOLIN HFA) 108 (90 Base) MCG/ACT inhaler Inhale 2 puffs into the lungs every 4 (four) hours as needed for shortness of breath. 1 each 0  . furosemide (LASIX) 20 MG tablet Take 1 tablet (20 mg total) by mouth daily. 90 tablet 3  . Multiple Vitamins-Minerals (CENTRUM SILVER PO) Take 1 tablet by mouth daily at 6 (six) AM.    . potassium chloride (KLOR-CON) 10 MEQ tablet Take 1 tablet (10 mEq total) by mouth daily. Take with lasix 90 tablet 3  . VITAMIN D, CHOLECALCIFEROL, PO Take 1 tablet by mouth daily at 6 (six) AM.     No facility-administered medications prior to visit.    Allergies  Allergen Reactions  . Penicillins Rash  . Codeine     ROS Review of Systems    Objective:    Physical Exam  BP 108/66   Pulse 66   Temp 98 F (36.7 C)   Resp 16   Ht 5' 4" (1.626 m)   Wt 144 lb 6 oz (65.5 kg)   SpO2 97%   BMI 24.78 kg/m  Wt Readings from Last 3 Encounters:  12/27/21 144 lb 6 oz (65.5 kg)  10/10/21 146 lb (66.2 kg)  09/19/21 142 lb (64.4 kg)     Health Maintenance Due  Topic Date Due  . Zoster Vaccines- Shingrix (1 of 2) Never done  . DEXA SCAN  Never done  . COVID-19 Vaccine (5 - Booster for Pfizer series) 02/07/2021    There are no preventive care reminders to display for this patient.  Lab Results  Component Value Date   TSH 1.39 12/10/2019   Lab Results  Component Value Date   WBC 7.3 06/28/2021   HGB 13.6 06/28/2021   HCT 40.7 06/28/2021   MCV 89.3 06/28/2021   PLT 279.0 06/28/2021   Lab Results  Component Value Date   NA 141 10/03/2021   K 4.6 10/03/2021   CO2 28 10/03/2021   GLUCOSE 95 10/03/2021   BUN 16 10/03/2021   CREATININE 0.78 10/03/2021   BILITOT 0.6 06/28/2021   ALKPHOS 54 06/28/2021   AST 25 06/28/2021   ALT 18 06/28/2021   PROT 6.8 06/28/2021   ALBUMIN 4.3 06/28/2021   CALCIUM 9.7 10/03/2021   EGFR 84 10/03/2021   GFR 79.64 06/28/2021  Lab Results   Component Value Date   HGBA1C 5.7 12/10/2019      Assessment & Plan:   Problem List Items Addressed This Visit   None   No orders of the defined types were placed in this encounter.   Follow-up: No follow-ups on file.    [name], FNP

## 2021-12-28 DIAGNOSIS — M2550 Pain in unspecified joint: Secondary | ICD-10-CM | POA: Insufficient documentation

## 2021-12-28 DIAGNOSIS — M542 Cervicalgia: Secondary | ICD-10-CM | POA: Insufficient documentation

## 2021-12-28 DIAGNOSIS — L03011 Cellulitis of right finger: Secondary | ICD-10-CM | POA: Insufficient documentation

## 2021-12-28 DIAGNOSIS — M6281 Muscle weakness (generalized): Secondary | ICD-10-CM | POA: Insufficient documentation

## 2021-12-28 DIAGNOSIS — R29898 Other symptoms and signs involving the musculoskeletal system: Secondary | ICD-10-CM | POA: Insufficient documentation

## 2021-12-28 DIAGNOSIS — R413 Other amnesia: Secondary | ICD-10-CM | POA: Insufficient documentation

## 2021-12-28 NOTE — Assessment & Plan Note (Signed)
Sed rate ana and rf ordered pending results

## 2021-12-28 NOTE — Assessment & Plan Note (Signed)
Ana workup pending results Bakers cyst unlikely based off of PE  Referral neurology for possible EMG  b12 folate pending results

## 2021-12-28 NOTE — Assessment & Plan Note (Signed)
Dg neck  Neck exercises  Heat/ice prn

## 2021-12-28 NOTE — Assessment & Plan Note (Signed)
b12 folate pending Referral to neurology for workup along with workup for leg weakness

## 2021-12-28 NOTE — Assessment & Plan Note (Signed)
RX doxycycline 100 mg po bid x 10 days Warm soaks to finger Monitor for s/s infection

## 2021-12-28 NOTE — Assessment & Plan Note (Signed)
Checking bmp pending results

## 2021-12-28 NOTE — Assessment & Plan Note (Signed)
b12 cbc cmp tsh  Pending results

## 2021-12-28 NOTE — Assessment & Plan Note (Signed)
Ordering ck pending results

## 2021-12-29 LAB — ANTI-NUCLEAR AB-TITER (ANA TITER): ANA Titer 1: 1:80 {titer} — ABNORMAL HIGH

## 2021-12-29 LAB — ANA: Anti Nuclear Antibody (ANA): POSITIVE — AB

## 2021-12-30 ENCOUNTER — Other Ambulatory Visit: Payer: Self-pay | Admitting: Family

## 2021-12-30 DIAGNOSIS — M542 Cervicalgia: Secondary | ICD-10-CM

## 2021-12-30 DIAGNOSIS — R7689 Other specified abnormal immunological findings in serum: Secondary | ICD-10-CM

## 2021-12-30 DIAGNOSIS — R29898 Other symptoms and signs involving the musculoskeletal system: Secondary | ICD-10-CM

## 2021-12-30 DIAGNOSIS — M2550 Pain in unspecified joint: Secondary | ICD-10-CM

## 2021-12-30 DIAGNOSIS — R768 Other specified abnormal immunological findings in serum: Secondary | ICD-10-CM

## 2021-12-30 DIAGNOSIS — R5382 Chronic fatigue, unspecified: Secondary | ICD-10-CM

## 2021-12-30 DIAGNOSIS — M6281 Muscle weakness (generalized): Secondary | ICD-10-CM

## 2022-01-03 ENCOUNTER — Encounter: Payer: Self-pay | Admitting: Physician Assistant

## 2022-01-09 ENCOUNTER — Encounter: Payer: Self-pay | Admitting: Family

## 2022-01-09 ENCOUNTER — Encounter: Payer: Self-pay | Admitting: Physician Assistant

## 2022-01-09 ENCOUNTER — Ambulatory Visit: Payer: Medicare Other | Admitting: Physician Assistant

## 2022-01-09 ENCOUNTER — Ambulatory Visit: Payer: Medicare Other | Admitting: Family

## 2022-01-09 VITALS — BP 128/72 | HR 67 | Temp 98.3°F | Resp 16 | Ht 64.0 in | Wt 146.0 lb

## 2022-01-09 VITALS — BP 126/78 | HR 59 | Resp 18 | Ht 64.0 in | Wt 147.0 lb

## 2022-01-09 DIAGNOSIS — R413 Other amnesia: Secondary | ICD-10-CM | POA: Diagnosis not present

## 2022-01-09 DIAGNOSIS — R569 Unspecified convulsions: Secondary | ICD-10-CM | POA: Diagnosis not present

## 2022-01-09 DIAGNOSIS — R202 Paresthesia of skin: Secondary | ICD-10-CM

## 2022-01-09 DIAGNOSIS — S80262A Insect bite (nonvenomous), left knee, initial encounter: Secondary | ICD-10-CM

## 2022-01-09 DIAGNOSIS — W57XXXA Bitten or stung by nonvenomous insect and other nonvenomous arthropods, initial encounter: Secondary | ICD-10-CM | POA: Insufficient documentation

## 2022-01-09 MED ORDER — MUPIROCIN CALCIUM 2 % EX CREA: 1.0000 "application " | TOPICAL_CREAM | Freq: Two times a day (BID) | CUTANEOUS | 0 refills | Status: DC

## 2022-01-09 MED ORDER — DOXYCYCLINE HYCLATE 100 MG PO TABS: ORAL_TABLET | ORAL | 0 refills | Status: DC

## 2022-01-09 MED ORDER — MUPIROCIN 2 % EX OINT
1.0000 | TOPICAL_OINTMENT | Freq: Two times a day (BID) | CUTANEOUS | 0 refills | Status: DC
Start: 2022-01-09 — End: 2022-03-07

## 2022-01-09 MED ORDER — DOXYCYCLINE HYCLATE 100 MG PO TABS: 100.0000 mg | ORAL_TABLET | Freq: Two times a day (BID) | ORAL | 0 refills | Status: DC

## 2022-01-09 NOTE — Progress Notes (Addendum)
Assessment/Plan:    Memory difficulties  Dawn Matthews is a very pleasant 66 y.o. 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 Exam and labs are unremarkable.  MoCA today is 29/30.   MRI brain with/without contrast to assess for underlying structural abnormality and assess vascular load  No indication for antidementia meds at this time Follow up in 1 month   Right leg paresthesia  Patient felt as if "the leg was was not there".  She is not sure that there was numbness; no tingling, lasting for about 1 min, no recurrence since late May. Other symptoms included 1 episode of B limbs "electrical sensation".  Denies C  or T spine pain.  Exam is unremarkable without concrete explanation for symptoms, such as cervical, thoracic or lumbar radiculopathy or myelopathy at this time .   Check EEG Will await for the MRI of the brain results to determine any  structural abnormalities    Subjective:    The patient is seen in neurologic consultation at the request of Dawn Pancoast, FNP for the evaluation of memory.  The patient is here alone This is a 66 y.o. year old RH  female who has had memory issues for about     How long did patient have memory difficulties? For several years, between 5-7 years, especially with simple words."I am catholic, when I take my walks I say the [facility], but 6 months ago couldn't remember it, so I got worried . I like to watch movies and if someone asks about the protagonist, I cannot come up immediately with the name except " is the person that begins with a B".  She likes to do crossword puzzles and reading, and watch game shows as [place].  Patient lives with:   Patient lives alone, she is in the process of divorce repeats oneself?  "If it is a  story, I might, not a whole lot" Disoriented when walking into a room?  Patient denies   Leaving objects in unusual places?  Patient denies   Ambulates  with difficulty? "At times I stagger to the R, not quite often"   She walks 3 or 4 miles a day and does swimming. She is to join the [employer] soon.  Recent falls?  Patient denies   Any head injuries?  Patient denies   History of seizures?   Patient denies   Wandering behavior?  Patient denies   Patient drives?   Getting worse at directions . Patient uses GPS to drive   Any mood changes ? " When I was younger I was moodier. Not a whole lot goes on in my life to cause agitation".  Any history of depression?:  Patient denies   Hallucinations?  Patient denies   Paranoia?  Patient denies "I am worry wort"  "Nervous Nelly" -as I am getting older"  Patient reports that he sleeps poorly," I have a system go to sleep 1-2-3 , then get get up a pot of coffee at 2 am, turn the TV on and fall asleep again . Denies vivid dreams, REM behavior or sleepwalking .    History of sleep apnea?  Patient denies   Any hygiene concerns?  Patient denies   Independent of bathing and dressing?  Endorsed  Does the patient needs help  with medications?   Patient denies   Who is in charge of the finances?  Patient is in charge   Any changes in appetite?  Endorsed.  She is trying to lose weight and trying to eat healthier, she skips lunch but she eats early dinner. Patient have trouble swallowing? Patient denies   Does the patient cook?  Patient denies   Any kitchen accidents such as leaving the stove on? Patient denies   Any headaches?  Patient denies   Double vision? Endorsed. " I blame it on my glasses, all of a sudden if doing different things I have to see because I have double vision. Have to sit and weigh it out"  Happened every few months, over the last 2 years. Due for eye checkup   Any focal numbness or tingling?  About 1 month ago, while walking, the patient felt "as if  the leg was missing, can't describe the feeling ", felt like her leg was going to give out or not work.  Happened twice in May, did not notice a foot drop, did not feel lifting her knee, she denies any pain.  This lasted for a minute at each episode, it was very "strange feeling ", no swelling, does not know if she felt numbness.  Denies any back pain.  Did not feel any electrical shock with that  event. However, she had one episode while lying in bed, 6-7 month ago when she felt  both  arms and legs  having an electrical shock without recurrence. Chronic back pain Patient denies   Unilateral weakness?  Patient denies   Any tremors?  Patient "never had a good sense of smell"  Any history of anosmia?  Patient denies   Any incontinence of urine?  Patient denies  Any bowel dysfunction?   Patient denies .  She had a recent colonoscopy on 09/19/2021 showing multiple polyps, followed by GI History of heavy alcohol intake?  Patient denies   History of heavy tobacco use?  Patient denies   Caffeine?  She drinks coffee 2 cups a day . Family history of dementia?  Patient denies . Questionable dementia in Mother (died at 31)  Single.  College education.  No children.  Originally from [place], [place], moved to in 2019 to be near her family.  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      Past Surgical History:  Procedure Laterality Date   DILATION AND CURETTAGE OF UTERUS     FOOT SURGERY  2010   SKIN CANCER EXCISION     basal cell   WISDOM TOOTH EXTRACTION       Allergies  Allergen Reactions   Penicillins Rash   Codeine     Current Outpatient  Medications  Medication Instructions   albuterol (VENTOLIN HFA) 108 (90 Base) MCG/ACT inhaler 2 puffs, Inhalation, Every 4 hours PRN   furosemide (LASIX) 20 mg, Oral, Daily   Multiple Vitamins-Minerals (CENTRUM SILVER PO) 1 tablet, Oral, Daily   potassium chloride (KLOR-CON) 10 MEQ tablet 10 mEq, Oral, Daily, Take with lasix   VITAMIN D, CHOLECALCIFEROL, PO 1  tablet, Oral, Daily     VITALS:   Vitals:   01/09/22 0747  BP: 126/78  Pulse: (!) 59  Resp: 18  SpO2: 98%  Weight: 147 lb (66.7 kg)  Height: '5\' 4"'$  (1.626 m)      06/28/2021    9:31 AM 06/14/2020   10:19 AM 12/10/2019    9:29 AM  Depression screen PHQ 2/9  Decreased Interest 0 3 0  Down, Depressed, Hopeless 0 1 0  PHQ - 2 Score 0 4 0  Altered sleeping  3   Tired, decreased energy  3   Change in appetite  3   Feeling bad or failure about yourself   0   Trouble concentrating  0   Moving slowly or fidgety/restless  0   Suicidal thoughts  0   PHQ-9 Score  13   Difficult doing work/chores  Not difficult at all     PHYSICAL EXAM   HEENT:  Normocephalic, atraumatic. The mucous membranes are moist. The superficial temporal arteries are without ropiness or tenderness. Cardiovascular: Regular rate and rhythm. Lungs: Clear to auscultation bilaterally. Neck: There are no carotid bruits noted bilaterally.  NEUROLOGICAL:    01/09/2022    9:00 AM  Montreal Cognitive Assessment   Visuospatial/ Executive (0/5) 5  Naming (0/3) 3  Attention: Read list of digits (0/2) 2  Attention: Read list of letters (0/1) 1  Attention: Serial 7 subtraction starting at 100 (0/3) 3  Language: Repeat phrase (0/2) 2  Language : Fluency (0/1) 1  Abstraction (0/2) 1  Delayed Recall (0/5) 5  Orientation (0/6) 6  Total 29  Adjusted Score (based on education) 29        View : No data to display.           Orientation:  Alert and oriented to person, place and time. No aphasia or dysarthria. Fund of knowledge is appropriate. Recent memory  impaired and remote memory intact.  Attention and concentration are normal.  Able to name objects and repeat phrases. Delayed recall   / Cranial nerves: There is good facial symmetry. Extraocular muscles are intact and visual fields are full to confrontational testing. Speech is fluent and clear. Soft palate rises symmetrically and there is no tongue deviation. Hearing is intact to conversational tone. Tone: Tone is good throughout. Sensation: Sensation is intact to light touch and pinprick throughout. Vibration is intact at the bilateral big toe.There is no extinction with double simultaneous stimulation. There is no sensory dermatomal level identified. Coordination: The patient has no difficulty with RAM's or FNF bilaterally. Normal finger to nose  Motor: Strength is 5/5 in the bilateral upper and lower extremities. There is no pronator drift. There are no fasciculations noted. DTR's: Deep tendon reflexes are 2/4 at the bilateral biceps, triceps, brachioradialis, patella and achilles.  Plantar responses are downgoing bilaterally. Gait and Station: The patient is able to ambulate without difficulty.The patient is able to heel toe walk without any difficulty.The patient is able to ambulate in a tandem fashion. The patient is able to stand in the Romberg position.     Thank you for allowing [place] the opportunity to participate in the care of this nice patient. Please do not hesitate to contact us for any questions or concerns.   Total time spent on today's visit was 61 minutes dedicated to this patient today, preparing to see patient, examining the patient, ordering tests and/or medications and counseling the patient, documenting clinical information in the EHR or other health  record, independently interpreting results and communicating results to the patient/family, discussing treatment and goals, answering patient's questions and coordinating care.  Cc:  Dawn Pancoast, FNP  Sharene Butters 01/09/2022  9:28 AM

## 2022-01-09 NOTE — Addendum Note (Signed)
Addended by: [name] on: 01/09/2022 12:58 PM   Modules accepted: Orders

## 2022-01-09 NOTE — Patient Instructions (Addendum)
It was a pleasure to see you today at our office.   Recommendations: MRI of the brain, the radiology office will call you to arrange you appointment EEG Follow up in 1 month  Whom to call:  Memory  decline, memory medications: Call our office [phone]   For psychiatric meds, mood meds: Please have your primary care physician manage these medications.   Counseling regarding caregiver distress, including caregiver depression, anxiety and issues regarding community resources, adult day care programs, adult living facilities, or memory care questions:   Feel free to contact Aragon, Social Worker at [phone]   For assessment of decision of mental capacity and competency:  Call Dr. Anthoney Harada, geriatric psychiatrist at [phone]  For guidance in geriatric dementia issues please call [facility] [phone]   If you have any severe symptoms of a stroke, or other severe issues such as confusion,severe chills or fever, etc call 911 or go to the ER as you may need to be evaluated further    RECOMMENDATIONS FOR ALL PATIENTS WITH MEMORY PROBLEMS: 1. Continue to exercise (Recommend 30 minutes of walking everyday, or 3 hours every week) 2. Increase social interactions - continue going to [place] and enjoy social gatherings with friends and family 3. Eat healthy, avoid fried foods and eat more fruits and vegetables 4. Maintain adequate blood pressure, blood sugar, and blood cholesterol level. Reducing the risk of stroke and cardiovascular disease also helps promoting better memory. 5. Avoid stressful situations. Live a simple life and avoid aggravations. Organize your time and prepare for the next day in anticipation. 6. Sleep well, avoid any interruptions of sleep and avoid any distractions in the bedroom that may interfere with adequate sleep quality 7. Avoid sugar, avoid sweets as there is a strong link between excessive sugar intake, diabetes, and  cognitive impairment We discussed the Mediterranean diet, which has been shown to help patients reduce the risk of progressive memory disorders and reduces cardiovascular risk. This includes eating fish, eat fruits and green leafy vegetables, nuts like almonds and hazelnuts, walnuts, and also use olive oil. Avoid fast foods and fried foods as much as possible. Avoid sweets and sugar as sugar use has been linked to worsening of memory function.  There is always a concern of gradual progression of memory problems. If this is the case, then we may need to adjust level of care according to patient needs. Support, both to the patient and caregiver, should then be put into place.      FALL PRECAUTIONS: Be cautious when walking. Scan the area for obstacles that may increase the risk of trips and falls. When getting up in the mornings, sit up at the edge of the bed for a few minutes before getting out of bed. Consider elevating the bed at the head end to avoid drop of blood pressure when getting up. Walk always in a well-lit room (use night lights in the walls). Avoid area rugs or power cords from appliances in the middle of the walkways. Use a walker or a cane if necessary and consider physical therapy for balance exercise. Get your eyesight checked regularly.  FINANCIAL OVERSIGHT: Supervision, especially oversight when making financial decisions or transactions is also recommended.  HOME SAFETY: Consider the safety of the kitchen when operating appliances like stoves, microwave oven, and blender. Consider having supervision and share cooking responsibilities until no longer able to participate in those. Accidents with firearms and other hazards in the house should be identified and  addressed as well.   ABILITY TO BE LEFT ALONE: If patient is unable to contact 911 operator, consider using LifeLine, or when the need is there, arrange for someone to stay with patients. Smoking is a fire hazard, consider  supervision or cessation. Risk of wandering should be assessed by caregiver and if detected at any point, supervision and safe proof recommendations should be instituted.  MEDICATION SUPERVISION: Inability to self-administer medication needs to be constantly addressed. Implement a mechanism to ensure safe administration of the medications.   DRIVING: Regarding driving, in patients with progressive memory problems, driving will be impaired. We advise to have someone else do the driving if trouble finding directions or if minor accidents are reported. Independent driving assessment is available to determine safety of driving.   If you are interested in the driving assessment, you can contact the following:  [employer] in [place]  [place] [phone] or [phone]    [place] refers to food and lifestyle choices that are based on the traditions of countries located on the [employer]. This way of eating has been shown to help prevent certain conditions and improve outcomes for people who have chronic diseases, like kidney disease and heart disease. What are tips for following this plan? Lifestyle  Cook and eat meals together with your family, when possible. Drink enough fluid to keep your urine clear or pale yellow. Be physically active every day. This includes: Aerobic exercise like running or swimming. Leisure activities like gardening, walking, or housework. Get 7-8 hours of sleep each night. If recommended by your health care provider, drink red wine in moderation. This means 1 glass a day for nonpregnant women and 2 glasses a day for men. A glass of wine equals 5 oz (150 mL). Reading food labels  Check the serving size of packaged foods. For foods such as rice and pasta, the serving size refers to the amount of cooked  product, not dry. Check the total fat in packaged foods. Avoid foods that have saturated fat or trans fats. Check the ingredients list for added sugars, such as corn syrup. Shopping  At the grocery store, buy most of your food from the areas near the walls of the store. This includes: Fresh fruits and vegetables (produce). Grains, beans, nuts, and seeds. Some of these may be available in unpackaged forms or large amounts (in bulk). Fresh seafood. Poultry and eggs. Low-fat dairy products. Buy whole ingredients instead of prepackaged foods. Buy fresh fruits and vegetables in-season from local farmers markets. Buy frozen fruits and vegetables in resealable bags. If you do not have access to quality fresh seafood, buy precooked frozen shrimp or canned fish, such as tuna, salmon, or sardines. Buy small amounts of raw or cooked vegetables, salads, or olives from the deli or salad bar at your store. Stock your pantry so you always have certain foods on hand, such as olive oil, canned tuna, canned tomatoes, rice, pasta, and beans. Cooking  Cook foods with extra-virgin olive oil instead of using butter or other vegetable oils. Have meat as a side dish, and have vegetables or grains as your main dish. This means having meat in small portions or adding small amounts of meat to foods like pasta or stew. Use beans or vegetables instead of meat in common dishes like chili or lasagna. Experiment with different cooking methods. Try roasting or broiling vegetables instead of steaming or  sauteing them. Add frozen vegetables to soups, stews, pasta, or rice. Add nuts or seeds for added healthy fat at each meal. You can add these to yogurt, salads, or vegetable dishes. Marinate fish or vegetables using olive oil, lemon juice, garlic, and fresh herbs. Meal planning  Plan to eat 1 vegetarian meal one day each week. Try to work up to 2 vegetarian meals, if possible. Eat seafood 2 or more times a week. Have  healthy snacks readily available, such as: Vegetable sticks with hummus. Greek yogurt. Fruit and nut trail mix. Eat balanced meals throughout the week. This includes: Fruit: 2-3 servings a day Vegetables: 4-5 servings a day Low-fat dairy: 2 servings a day Fish, poultry, or lean meat: 1 serving a day Beans and legumes: 2 or more servings a week Nuts and seeds: 1-2 servings a day Whole grains: 6-8 servings a day Extra-virgin olive oil: 3-4 servings a day Limit red meat and sweets to only a few servings a month What are my food choices? Mediterranean diet Recommended Grains: Whole-grain pasta. Brown rice. Bulgar wheat. Polenta. Couscous. Whole-wheat bread. [place]. Vegetables: Artichokes. Beets. Broccoli. Cabbage. Carrots. Eggplant. Green beans. Chard. Kale. Spinach. Onions. Leeks. Peas. Squash. Tomatoes. Peppers. Radishes. Fruits: Apples. Apricots. Avocado. Berries. Bananas. Cherries. Dates. Figs. Grapes. Lemons. Melon. Oranges. Peaches. Plums. Pomegranate. Meats and other protein foods: Beans. Almonds. Sunflower seeds. Pine nuts. Peanuts. [place]. Salmon. Scallops. Shrimp. [place]. Tilapia. Clams. Oysters. Eggs. Dairy: Low-fat milk. Cheese. Greek yogurt. Beverages: Water. Red wine. Herbal tea. Fats and oils: Extra virgin olive oil. Avocado oil. Grape seed oil. Sweets and desserts: [place] yogurt with honey. Baked apples. Poached pears. Trail mix. Seasoning and other foods: Basil. Cilantro. Coriander. Cumin. Mint. Parsley. Sage. Rosemary. Tarragon. Garlic. Oregano. Thyme. Pepper. Balsalmic vinegar. Tahini. Hummus. Tomato sauce. Olives. Mushrooms. Limit these Grains: Prepackaged pasta or rice dishes. Prepackaged cereal with added sugar. Vegetables: Deep fried potatoes (french fries). Fruits: Fruit canned in syrup. Meats and other protein foods: Beef. Pork. Lamb. Poultry with skin. Hot dogs. [place] Salines. Dairy: Ice cream. Sour cream. Whole milk. Beverages: Juice. Sugar-sweetened soft drinks.  Beer. Liquor and spirits. Fats and oils: Butter. Canola oil. Vegetable oil. Beef fat (tallow). Lard. Sweets and desserts: Cookies. Cakes. Pies. Candy. Seasoning and other foods: Mayonnaise. Premade sauces and marinades. The items listed may not be a complete list. Talk with your dietitian about what dietary choices are right for you. Summary The Mediterranean diet includes both food and lifestyle choices. Eat a variety of fresh fruits and vegetables, beans, nuts, seeds, and whole grains. Limit the amount of red meat and sweets that you eat. Talk with your health care provider about whether it is safe for you to drink red wine in moderation. This means 1 glass a day for nonpregnant women and 2 glasses a day for men. A glass of wine equals 5 oz (150 mL). This information is not intended to replace advice given to you by your health care provider. Make sure you discuss any questions you have with your health care provider. Document Released: 03/16/2016 Document Revised: 04/18/2016 Document Reviewed: 03/16/2016 Elsevier Interactive Patient Education  2017 [employer].    We have sent a referral to [facility] for your MRI and they will call you directly to schedule your appointment. They are located at [place]. If you need to contact them directly please call [phone].

## 2022-01-09 NOTE — Assessment & Plan Note (Signed)
Tick pieces are all out.  Monitor site for s/s worsening infection Apply mupirocin ointment to site

## 2022-01-09 NOTE — Progress Notes (Signed)
Established Patient Office Visit  Subjective:  Patient ID: Dawn Matthews, female    DOB: 1956-04-19  Age: 66 y.o. MRN: [id number]  CC:  Chief Complaint  Patient presents with   Tick Removal    Left knee    HPI Dawn Matthews is here today with concerns.  Worked in the garden a few days ago.  Was scratching the back of her left posterior knee, upper calf and it was scratching and itchy. She thought she saw something black, suspected a tick bite.   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     Past Surgical History:  Procedure Laterality Date   DILATION AND CURETTAGE OF UTERUS     FOOT SURGERY  2010   SKIN CANCER EXCISION     basal cell   WISDOM TOOTH EXTRACTION      Family History  Problem Relation Age of Onset   Diabetes Mother    COPD Mother    Hypertension Mother    Atrial fibrillation Mother    Diabetes Father    Melanoma Sister    Hypertension Sister    Lung cancer Sister        smoker   Melanoma Brother    Diabetes Maternal Grandmother    Breast cancer Neg Hx    Colon polyps Neg Hx    Colon cancer Neg Hx    Esophageal cancer Neg Hx    Rectal cancer Neg Hx    Stomach cancer Neg Hx     Social History   Socioeconomic History   Marital status: Single    Spouse name: Not on file   Number of children: 0   Years of education: College   Highest education level: Not on file  Occupational History   Not on file  Tobacco Use   Smoking status: Never   Smokeless tobacco: Never  Vaping Use   Vaping Use: Never used  Substance and Sexual Activity   Alcohol use: Yes    Alcohol/week: 3.0 standard drinks    Types: 3 Standard drinks or equivalent per week   Drug use: Never   Sexual activity: Not Currently  Other Topics Concern   Not on file  Social History Narrative   12/10/19    From: [place], [place] - moved to [facility] in 2019 to be near family   Living: alone    Pet: dog - german shepherd   Work: retired from [employer] and [employer]      Family: sister nearby, but does have family in [place] still      Enjoys: walking, visiting small towns, crossword puzzles, drink coffee      Exercise: walking 3-4 miles per day   Diet: likes ice cream eater, trying to eat healthier now, usually skips lunch and eats early dinner      Safety   Seat belts: Yes    Guns: No   Safe in relationships: Yes    Social Determinants of Radio broadcast assistant Strain: Not on file  Food Insecurity: Not on file  Transportation Needs: Not on file  Physical Activity: Not on file  Stress: Not on file  Social Connections: Not on file  Intimate Partner Violence: Not on file    Outpatient Medications Prior to Visit  Medication Sig Dispense Refill   albuterol (VENTOLIN HFA) 108 (90 Base) MCG/ACT inhaler Inhale 2 puffs into the lungs every 4 (four) hours as needed for shortness of breath. 1 each 0   furosemide (LASIX) 20 MG tablet Take 1 tablet (20 mg total) by mouth daily. 90 tablet 3   Multiple Vitamins-Minerals (CENTRUM SILVER PO) Take 1 tablet by mouth daily at 6 (six) AM.     potassium chloride (KLOR-CON) 10 MEQ tablet Take 1 tablet (10 mEq total) by mouth daily. Take with lasix 90 tablet 3   VITAMIN D, CHOLECALCIFEROL, PO Take 1 tablet by mouth daily at 6 (six) AM.     No facility-administered medications prior to visit.    Allergies  Allergen Reactions   Penicillins Rash   Codeine         Objective:    Physical Exam Constitutional:      General: She is not in acute distress.    Appearance: Normal appearance. She is normal weight. She is not ill-appearing, toxic-appearing or diaphoretic.  Pulmonary:     Effort: Pulmonary effort is normal.  Skin:    Comments: Left posterior knee with mild surrounding erythema with healing scabbed over site. No drainage at site.    Neurological:     General: No focal deficit present.     Mental Status: She is alert and oriented to person, place, and time.  Psychiatric:        Mood and Affect: Mood normal.        Behavior: Behavior normal.        Thought Content: Thought content normal.        Judgment: Judgment normal.      BP 128/72   Pulse 67   Temp 98.3 F (36.8 C)   Resp 16   Ht '5\' 4"'  (1.626 m)   Wt 146 lb (66.2 kg)   SpO2 97%   BMI 25.06 kg/m  Wt Readings from Last 3 Encounters:  01/09/22 146 lb (66.2 kg)  01/09/22 147 lb (66.7 kg)  12/27/21 144 lb 6 oz (65.5 kg)     Health Maintenance Due  Topic Date Due   Zoster Vaccines- Shingrix (1 of 2) Never done   DEXA SCAN  Never done   COVID-19 Vaccine (5 - Booster for Pfizer series) 02/07/2021    There are no preventive care reminders to display for this patient.  Lab Results  Component Value Date   TSH 1.35 12/27/2021   Lab Results  Component Value Date   WBC 4.8 12/27/2021   HGB 13.9 12/27/2021   HCT 41.4 12/27/2021   MCV 89.1 12/27/2021   PLT 313.0 12/27/2021   Lab Results  Component Value Date   NA 138 12/27/2021   K 4.4 12/27/2021   CO2 30 12/27/2021   GLUCOSE 93 12/27/2021   BUN 14 12/27/2021   CREATININE 0.82 12/27/2021   BILITOT 0.6 06/28/2021   ALKPHOS 54 06/28/2021   AST 25 06/28/2021   ALT 18 06/28/2021   PROT 6.8 06/28/2021   ALBUMIN 4.3 06/28/2021   CALCIUM 9.6 12/27/2021   EGFR 84 10/03/2021   GFR 74.74 12/27/2021   Lab Results  Component Value Date   HGBA1C 5.7 12/10/2019      Assessment & Plan:   Problem List Items Addressed This Visit       Musculoskeletal and Integument   Tick bite of left knee - Primary    Tick pieces are all out.  Monitor site for s/s worsening  infection Apply mupirocin ointment to site         Relevant Medications   doxycycline (VIBRA-TABS) 100 MG tablet   mupirocin cream (BACTROBAN) 2 %    Meds ordered this encounter  Medications   DISCONTD: doxycycline  (VIBRA-TABS) 100 MG tablet    Sig: Take 1 tablet (100 mg total) by mouth 2 (two) times daily for 10 days.    Dispense:  20 tablet    Refill:  0    Order Specific Question:   Supervising Provider    Answer:   BEDSOLE, AMY E [[id number]]   doxycycline (VIBRA-TABS) 100 MG tablet    Sig: Take two tablets po qd for one dose    Dispense:  2 tablet    Refill:  0    Order Specific Question:   Supervising Provider    Answer:   BEDSOLE, AMY E [[id number]]   mupirocin cream (BACTROBAN) 2 %    Sig: Apply 1 application. topically 2 (two) times daily.    Dispense:  15 g    Refill:  0    Order Specific Question:   Supervising Provider    Answer:   BEDSOLE, AMY E [[id number]]    Follow-up: Return if symptoms worsen or fail to improve.    [name], FNP

## 2022-01-09 NOTE — Patient Instructions (Signed)
Take doxycycline Apply ointment to site  Monitor for worsening s/s infection.   Due to recent changes in healthcare laws, you may see results of your imaging and/or laboratory studies on MyChart before I have had a chance to review them.  I understand that in some cases there may be results that are confusing or concerning to you. Please understand that not all results are received at the same time and often I may need to interpret multiple results in order to provide you with the best plan of care or course of treatment. Therefore, I ask that you please give me 2 business days to thoroughly review all your results before contacting my office for clarification. Should we see a critical lab result, you will be contacted sooner.   It was a pleasure seeing you today! Please do not hesitate to reach out with any questions and or concerns.  Regards,   [name] FNP-C

## 2022-01-16 ENCOUNTER — Ambulatory Visit: Admission: RE | Admit: 2022-01-16 | Payer: Medicare Other | Source: Ambulatory Visit

## 2022-01-18 ENCOUNTER — Other Ambulatory Visit: Payer: Medicare Other

## 2022-01-19 ENCOUNTER — Telehealth: Payer: Self-pay

## 2022-01-19 NOTE — Telephone Encounter (Signed)
Patient cancelled appt for MRI brain wo contrast, fyi.

## 2022-01-20 NOTE — Telephone Encounter (Signed)
I advised patient to rescedule MRI, Per Sharene Butters PA-C

## 2022-01-30 ENCOUNTER — Encounter: Payer: Self-pay | Admitting: Internal Medicine

## 2022-02-21 ENCOUNTER — Ambulatory Visit: Payer: Medicare Other | Admitting: Physician Assistant

## 2022-03-06 DIAGNOSIS — Z85828 Personal history of other malignant neoplasm of skin: Secondary | ICD-10-CM | POA: Diagnosis not present

## 2022-03-06 DIAGNOSIS — L821 Other seborrheic keratosis: Secondary | ICD-10-CM | POA: Diagnosis not present

## 2022-03-06 DIAGNOSIS — L309 Dermatitis, unspecified: Secondary | ICD-10-CM | POA: Diagnosis not present

## 2022-03-06 DIAGNOSIS — L853 Xerosis cutis: Secondary | ICD-10-CM | POA: Diagnosis not present

## 2022-03-06 DIAGNOSIS — L608 Other nail disorders: Secondary | ICD-10-CM | POA: Diagnosis not present

## 2022-03-06 DIAGNOSIS — D1801 Hemangioma of skin and subcutaneous tissue: Secondary | ICD-10-CM | POA: Diagnosis not present

## 2022-03-06 DIAGNOSIS — L570 Actinic keratosis: Secondary | ICD-10-CM | POA: Diagnosis not present

## 2022-03-06 DIAGNOSIS — D229 Melanocytic nevi, unspecified: Secondary | ICD-10-CM | POA: Diagnosis not present

## 2022-03-06 DIAGNOSIS — L814 Other melanin hyperpigmentation: Secondary | ICD-10-CM | POA: Diagnosis not present

## 2022-03-07 ENCOUNTER — Ambulatory Visit (AMBULATORY_SURGERY_CENTER): Payer: Self-pay | Admitting: *Deleted

## 2022-03-07 VITALS — Ht 64.0 in | Wt 146.0 lb

## 2022-03-07 DIAGNOSIS — Z86010 Personal history of colonic polyps: Secondary | ICD-10-CM

## 2022-03-07 MED ORDER — NA SULFATE-K SULFATE-MG SULF 17.5-3.13-1.6 GM/177ML PO SOLN: 1.0000 | ORAL | 0 refills | Status: DC

## 2022-03-07 NOTE — Progress Notes (Signed)
Patient is here in-person for PV. Patient denies any allergies to eggs or soy. Patient denies any problems with anesthesia/sedation. Patient is not on any oxygen at home. Patient is not taking any diet/weight loss medications or blood thinners. Went over procedure prep instructions with the patient. Patient is aware of our care-partner policy. Patient notified to use Singlecare card given to pt for prescription.

## 2022-03-27 ENCOUNTER — Telehealth: Payer: Self-pay | Admitting: Family

## 2022-03-27 NOTE — Telephone Encounter (Signed)
Left message for patient to call back and schedule Medicare Annual Wellness Visit (AWV) either virtually or phone   *due 12/05/21 awvi per [facility]    This should be a 45 minute visit.  I left my direct # [phone]

## 2022-03-28 ENCOUNTER — Encounter: Payer: Self-pay | Admitting: Internal Medicine

## 2022-03-31 ENCOUNTER — Ambulatory Visit: Payer: Medicare Other

## 2022-04-02 ENCOUNTER — Encounter: Payer: Self-pay | Admitting: Certified Registered Nurse Anesthetist

## 2022-04-05 ENCOUNTER — Ambulatory Visit (AMBULATORY_SURGERY_CENTER): Payer: Medicare Other | Admitting: Internal Medicine

## 2022-04-05 ENCOUNTER — Encounter: Payer: Self-pay | Admitting: Internal Medicine

## 2022-04-05 VITALS — BP 107/57 | HR 78 | Temp 98.0°F | Resp 16 | Ht 64.0 in | Wt 146.0 lb

## 2022-04-05 DIAGNOSIS — Z86010 Personal history of colonic polyps: Secondary | ICD-10-CM | POA: Diagnosis not present

## 2022-04-05 DIAGNOSIS — D120 Benign neoplasm of cecum: Secondary | ICD-10-CM | POA: Diagnosis not present

## 2022-04-05 DIAGNOSIS — Z09 Encounter for follow-up examination after completed treatment for conditions other than malignant neoplasm: Secondary | ICD-10-CM | POA: Diagnosis not present

## 2022-04-05 DIAGNOSIS — D122 Benign neoplasm of ascending colon: Secondary | ICD-10-CM | POA: Diagnosis not present

## 2022-04-05 DIAGNOSIS — K635 Polyp of colon: Secondary | ICD-10-CM | POA: Diagnosis not present

## 2022-04-05 DIAGNOSIS — D124 Benign neoplasm of descending colon: Secondary | ICD-10-CM

## 2022-04-05 MED ORDER — SODIUM CHLORIDE 0.9 % IV SOLN: 500.0000 mL | Freq: Once | INTRAVENOUS | Status: DC

## 2022-04-05 NOTE — Progress Notes (Signed)
Pt's states no medical or surgical changes since previsit or office visit. 

## 2022-04-05 NOTE — Patient Instructions (Addendum)
Handouts provided on polyps and hemorrhoids.   YOU HAD AN ENDOSCOPIC PROCEDURE TODAY AT THE [facility]:   Refer to the procedure report that was given to you for any specific questions about what was found during the examination.  If the procedure report does not answer your questions, please call your gastroenterologist to clarify.  If you requested that your care partner not be given the details of your procedure findings, then the procedure report has been included in a sealed envelope for you to review at your convenience later.  YOU SHOULD EXPECT: Some feelings of bloating in the abdomen. Passage of more gas than usual.  Walking can help get rid of the air that was put into your GI tract during the procedure and reduce the bloating. If you had a lower endoscopy (such as a colonoscopy or flexible sigmoidoscopy) you may notice spotting of blood in your stool or on the toilet paper. If you underwent a bowel prep for your procedure, you may not have a normal bowel movement for a few days.  Please Note:  You might notice some irritation and congestion in your nose or some drainage.  This is from the oxygen used during your procedure.  There is no need for concern and it should clear up in a day or so.  SYMPTOMS TO REPORT IMMEDIATELY:  Following lower endoscopy (colonoscopy or flexible sigmoidoscopy):  Excessive amounts of blood in the stool  Significant tenderness or worsening of abdominal pains  Swelling of the abdomen that is new, acute  Fever of 100F or higher  For urgent or emergent issues, a gastroenterologist can be reached at any hour by calling [phone]. Do not use MyChart messaging for urgent concerns.    DIET:  We do recommend a small meal at first, but then you may proceed to your regular diet.  Drink plenty of fluids but you should avoid alcoholic beverages for 24 hours.  ACTIVITY:  You should plan to take it easy for the rest of today and you should NOT DRIVE  or use heavy machinery until tomorrow (because of the sedation medicines used during the test).    FOLLOW UP: Our staff will call the number listed on your records the next business day following your procedure.  We will call around 7:15- 8:00 am to check on you and address any questions or concerns that you may have regarding the information given to you following your procedure. If we do not reach you, we will leave a message.  If you develop any symptoms (ie: fever, flu-like symptoms, shortness of breath, cough etc.) before then, please call [phone].  If you test positive for Covid 19 in the 2 weeks post procedure, please call and report this information to us.    If any biopsies were taken you will be contacted by phone or by letter within the next 1-3 weeks.  Please call us at [phone] if you have not heard about the biopsies in 3 weeks.    SIGNATURES/CONFIDENTIALITY: You and/or your care partner have signed paperwork which will be entered into your electronic medical record.  These signatures attest to the fact that that the information above on your After Visit Summary has been reviewed and is understood.  Full responsibility of the confidentiality of this discharge information lies with you and/or your care-partner.  

## 2022-04-05 NOTE — Progress Notes (Signed)
VSS, transported to PACU °

## 2022-04-05 NOTE — Progress Notes (Signed)
GASTROENTEROLOGY PROCEDURE H&P NOTE   Primary Care Physician: [name], FNP    Reason for Procedure:   History of colon polyps  Plan:    Colonoscopy  Patient is appropriate for endoscopic procedure(s) in the ambulatory ([place]) setting.  The nature of the procedure, as well as the risks, benefits, and alternatives were carefully and thoroughly reviewed with the patient. Ample time for discussion and questions allowed. The patient understood, was satisfied, and agreed to proceed.     HPI: Dawn Matthews is a 66 y.o. female who presents for colonoscopy for history of colon polyps.   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 Authorizing Provider  furosemide (LASIX) 20 MG tablet Take 1 tablet (20 mg total) by mouth daily. 09/12/21  Yes Gollan, Kathlene November, MD  Multiple Vitamins-Minerals (CENTRUM SILVER PO) Take 1 tablet by mouth daily at 6 (six) AM.   Yes [provider]  potassium chloride (KLOR-CON) 10 MEQ tablet Take 1 tablet (10 mEq total) by mouth daily. Take with lasix 09/12/21  Yes Gollan, Kathlene November, MD  VITAMIN D, CHOLECALCIFEROL, PO Take 1 tablet by mouth daily at 6 (six) AM.   Yes [provider]  albuterol (VENTOLIN HFA) 108 (90 Base) MCG/ACT inhaler Inhale 2 puffs into the lungs every 4 (four) hours as needed for shortness of breath. Patient not taking: Reported on 03/07/2022 07/25/21   [name], FNP  halobetasol (ULTRAVATE) 0.05 % ointment SMARTSIG:1 Topical Daily  03/06/22   [provider]    Current Outpatient Medications  Medication Sig Dispense Refill   furosemide (LASIX) 20 MG tablet Take 1 tablet (20 mg total) by mouth daily. 90 tablet 3   Multiple Vitamins-Minerals (CENTRUM SILVER PO) Take 1 tablet by mouth daily at 6 (six) AM.     potassium chloride (KLOR-CON) 10 MEQ tablet Take 1 tablet (10 mEq total) by mouth daily. Take with lasix 90 tablet 3   VITAMIN D, CHOLECALCIFEROL, PO Take 1 tablet by mouth daily at 6 (six) AM.     albuterol (VENTOLIN HFA) 108 (90 Base) MCG/ACT inhaler Inhale 2 puffs into the lungs every 4 (four) hours as needed for shortness of breath. (Patient not taking: Reported on 03/07/2022) 1 each 0   halobetasol (ULTRAVATE) 0.05 % ointment SMARTSIG:1 Topical Daily     Current Facility-Administered Medications  Medication Dose Route Frequency Provider Last Rate Last Admin   0.9 %  sodium chloride infusion  500 mL Intravenous Once Sharyn Creamer, MD        Allergies as of 04/05/2022 - Review Complete 04/05/2022  Allergen Reaction Noted   Penicillins Rash 10/14/2020   Codeine  12/19/2018    Family History  Problem Relation Age of Onset   Diabetes Mother    COPD Mother    Hypertension Mother    Atrial fibrillation Mother    Diabetes Father  Melanoma Sister    Hypertension Sister    Lung cancer Sister        smoker   Melanoma Brother    Diabetes Maternal Grandmother    Breast cancer Neg Hx    Colon polyps Neg Hx    Colon cancer Neg Hx    Esophageal cancer Neg Hx    Rectal cancer Neg Hx    Stomach cancer Neg Hx     Social History   Socioeconomic History   Marital status: Single    Spouse name: Not on file   Number of children: 0   Years of education: College   Highest education level: Not on file  Occupational History   Not on file  Tobacco Use   Smoking status: Never   Smokeless tobacco: Never  Vaping Use   Vaping Use: Never used  Substance and Sexual Activity   Alcohol use: Not Currently     Alcohol/week: 0.0 - 3.0 standard drinks of alcohol   Drug use: Never   Sexual activity: Not Currently  Other Topics Concern   Not on file  Social History Narrative   12/10/19   From: [place] - moved to [place] in 2019 to be near family   Living: alone    Pet: dog - german shepherd   Work: retired from [employer] and [employer]      Family: sister nearby, but does have family in [place] still      Enjoys: walking, visiting small towns, crossword puzzles, drink coffee      Exercise: walking 3-4 miles per day   Diet: likes ice cream eater, trying to eat healthier now, usually skips lunch and eats early dinner      Safety   Seat belts: Yes    Guns: No   Safe in relationships: Yes    Social Determinants of Radio broadcast assistant Strain: Not on file  Food Insecurity: Not on file  Transportation Needs: Not on file  Physical Activity: Not on file  Stress: Not on file  Social Connections: Not on file  Intimate Partner Violence: Not on file    Physical Exam: Vital signs in last 24 hours: BP 131/87   Pulse 60   Temp 98 F (36.7 C)   Resp 14   Ht '5\' 4"'$  (1.626 m)   Wt 146 lb (66.2 kg)   SpO2 99%   BMI 25.06 kg/m  GEN: NAD EYE: Sclerae anicteric ENT: MMM CV: Non-tachycardic Pulm: No increased work of breathing GI: Soft, NT/ND NEURO:  Alert & Oriented   [name], MD [facility]  04/05/2022 8:12 AM

## 2022-04-05 NOTE — Progress Notes (Signed)
Called to room to assist during endoscopic procedure.  Patient ID and intended procedure confirmed with present staff. Received instructions for my participation in the procedure from the performing physician.  

## 2022-04-05 NOTE — Op Note (Signed)
[facility] Patient Name: Dawn Matthews Procedure Date: 04/05/2022 7:58 AM MRN: [id number] Endoscopist: Sonny Masters "Dawn Matthews ,  Age: 66 Referring MD:  Date of Birth: [birth year] Gender: Female Account #: [id number] Procedure:                Colonoscopy Indications:              High risk colon cancer surveillance: Personal                            history of colonic polyps Medicines:                Monitored Anesthesia Care Procedure:                Pre-Anesthesia Assessment:                           - Prior to the procedure, a History and Physical                            was performed, and patient medications and                            allergies were reviewed. The patient's tolerance of                            previous anesthesia was also reviewed. The risks                            and benefits of the procedure and the sedation                            options and risks were discussed with the patient.                            All questions were answered, and informed consent                            was obtained. Prior Anticoagulants: The patient has                            taken no previous anticoagulant or antiplatelet                            agents. ASA Grade Assessment: II - A patient with                            mild systemic disease. After reviewing the risks                            and benefits, the patient was deemed in                            satisfactory condition to undergo the procedure.  After obtaining informed consent, the colonoscope                            was passed under direct vision. Throughout the                            procedure, the patient's blood pressure, pulse, and                            oxygen saturations were monitored continuously. The                            PCF-HQ190L Colonoscope was introduced through the                            anus and advanced to the the  terminal ileum. The                            colonoscopy was performed without difficulty. The                            patient tolerated the procedure well. The quality                            of the bowel preparation was excellent. The                            terminal ileum, ileocecal valve, appendiceal                            orifice, and rectum were photographed. Scope In: 8:14:01 AM Scope Out: 8:40:55 AM Scope Withdrawal Time: 0 hours 22 minutes 49 seconds  Total Procedure Duration: 0 hours 26 minutes 54 seconds  Findings:                 The terminal ileum appeared normal.                           A 3 mm polyp was found in the cecum. The polyp was                            sessile. The polyp was removed with a cold snare.                            Resection and retrieval were complete.                           A 4 mm polyp was found in the ascending colon. The                            polyp was sessile. The polyp was removed with a                            cold snare.  Resection and retrieval were complete.                           Four sessile polyps were found in the descending                            colon. The polyps were 3 to 5 mm in size. These                            polyps were removed with a cold snare. Resection                            and retrieval were complete.                           Non-bleeding internal hemorrhoids were found during                            retroflexion. Complications:            No immediate complications. Estimated Blood Loss:     Estimated blood loss was minimal. Impression:               - The examined portion of the ileum was normal.                           - One 3 mm polyp in the cecum, removed with a cold                            snare. Resected and retrieved.                           - One 4 mm polyp in the ascending colon, removed                            with a cold snare. Resected and retrieved.                            - Four 3 to 5 mm polyps in the descending colon,                            removed with a cold snare. Resected and retrieved.                           - Non-bleeding internal hemorrhoids. Recommendation:           - Discharge patient to home (with escort).                           - Await pathology results.                           - Repeat colonoscopy in 1 year for surveillance.                           -  The findings and recommendations were discussed                            with the patient. Dr Georgian Co "[facility]" [place],  04/05/2022 8:50:04 AM

## 2022-04-06 ENCOUNTER — Telehealth: Payer: Self-pay | Admitting: *Deleted

## 2022-04-06 NOTE — Telephone Encounter (Signed)
  Follow up Call-     04/05/2022    7:24 AM 09/19/2021   10:40 AM  Call back number  Post procedure Call Back phone  # [phone]  Permission to leave phone message Yes Yes     Patient questions:  Busy signal.

## 2022-04-07 ENCOUNTER — Encounter: Payer: Self-pay | Admitting: Internal Medicine

## 2022-04-07 ENCOUNTER — Ambulatory Visit (INDEPENDENT_AMBULATORY_CARE_PROVIDER_SITE_OTHER): Payer: Medicare Other

## 2022-04-07 VITALS — Ht 64.0 in | Wt 144.0 lb

## 2022-04-07 DIAGNOSIS — Z0000 Encounter for general adult medical examination without abnormal findings: Secondary | ICD-10-CM | POA: Diagnosis not present

## 2022-04-07 NOTE — Progress Notes (Signed)
I connected with Dawn Matthews today by telephone and verified that I am speaking with the correct person using two identifiers. Location patient: home Location provider: work Persons participating in the virtual visit: Dawn Matthews, Glenna Durand LPN.   I discussed the limitations, risks, security and privacy concerns of performing an evaluation and management service by telephone and the availability of in person appointments. I also discussed with the patient that there may be a patient responsible charge related to this service. The patient expressed understanding and verbally consented to this telephonic visit.    Interactive audio and video telecommunications were attempted between this provider and patient, however failed, due to patient having technical difficulties OR patient did not have access to video capability.  We continued and completed visit with audio only.     Vital signs may be patient reported or missing.  Subjective:   Dawn Matthews is a 66 y.o. female who presents for an Initial Medicare Annual Wellness Visit.  Review of Systems     Cardiac Risk Factors include: advanced age (>26mn, >>35women)     Objective:    Today's Vitals   04/07/22 0811  Weight: 144 lb (65.3 kg)  Height: '5\' 4"'$  (1.626 m)   Body mass index is 24.72 kg/m.     04/07/2022    8:16 AM 01/09/2022    7:48 AM  Advanced Directives  Does Patient Have a Medical Advance Directive? No No    Current Medications (verified) Outpatient Encounter Medications as of 04/07/2022  Medication Sig   furosemide (LASIX) 20 MG tablet Take 1 tablet (20 mg total) by mouth daily.   halobetasol (ULTRAVATE) 0.05 % ointment SMARTSIG:1 Topical Daily   Multiple Vitamins-Minerals (CENTRUM SILVER PO) Take 1 tablet by mouth daily at 6 (six) AM.   potassium chloride (KLOR-CON) 10 MEQ tablet Take 1 tablet (10 mEq total) by mouth daily. Take with lasix   VITAMIN D, CHOLECALCIFEROL, PO Take 1 tablet by mouth  daily at 6 (six) AM.   albuterol (VENTOLIN HFA) 108 (90 Base) MCG/ACT inhaler Inhale 2 puffs into the lungs every 4 (four) hours as needed for shortness of breath. (Patient not taking: Reported on 03/07/2022)   No facility-administered encounter medications on file as of 04/07/2022.    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    Past Surgical History:  Procedure Laterality Date   COLONOSCOPY  09/19/2021   DLorenso Courier  DILATION AND CURETTAGE OF UTERUS     FOOT SURGERY  2010   POLYPECTOMY     SKIN CANCER EXCISION     basal cell   WISDOM TOOTH EXTRACTION     Family History  Problem Relation Age of Onset   Diabetes Mother    COPD Mother    Hypertension Mother    Atrial fibrillation Mother    Diabetes Father    Melanoma Sister    Hypertension Sister    Lung cancer Sister        smoker   Melanoma Brother    Diabetes Maternal Grandmother    Breast cancer Neg Hx    Colon polyps Neg Hx    Colon cancer Neg Hx    Esophageal cancer Neg Hx    Rectal cancer Neg Hx  Stomach cancer Neg Hx    Social History   Socioeconomic History   Marital status: Single    Spouse name: Not on file   Number of children: 0   Years of education: College   Highest education level: Not on file  Occupational History   Not on file  Tobacco Use   Smoking status: Never   Smokeless tobacco: Never  Vaping Use   Vaping Use: Never used  Substance and Sexual Activity   Alcohol use: Not Currently    Alcohol/week: 0.0 - 3.0 standard drinks of alcohol   Drug use: Never   Sexual activity: Not Currently  Other Topics Concern   Not on file  Social History Narrative   12/10/19   From: [place] - moved to [place] in 2019 to be near family   Living: alone    Pet: dog - german  shepherd   Work: retired from [employer] and [employer]      Family: sister nearby, but does have family in [place] still      Enjoys: walking, visiting small towns, crossword puzzles, drink coffee      Exercise: walking 3-4 miles per day   Diet: likes ice cream eater, trying to eat healthier now, usually skips lunch and eats early dinner      Safety   Seat belts: Yes    Guns: No   Safe in relationships: Yes    Social Determinants of Health   Financial Resource Strain: Low Risk  (04/07/2022)   Overall Financial Resource Strain (CARDIA)    Difficulty of Paying Living Expenses: Not hard at all  Food Insecurity: No Food Insecurity (04/07/2022)   Hunger Vital Sign    Worried About Running Out of Food in the Last Year: Never true    [place] in the Last Year: Never true  Transportation Needs: No Transportation Needs (04/07/2022)   PRAPARE - Hydrologist (Medical): No    Lack of Transportation (Non-Medical): No  Physical Activity: Sufficiently Active (04/07/2022)   Exercise Vital Sign    Days of Exercise per Week: 7 days    Minutes of Exercise per Session: 30 min  Stress: No Stress Concern Present (04/07/2022)   [place]    Feeling of Stress : Not at all  Social Connections: Not on file    Tobacco Counseling Counseling given: Not Answered   Clinical Intake:  Pre-visit preparation completed: Yes  Pain : No/denies pain     Nutritional Status: BMI of 19-24  Normal Diabetes: No  How often do you need to have someone help you when you read instructions, pamphlets, or other written materials from your doctor or pharmacy?: 1 - Never What is the last grade level you completed in school?: 55yr college  Diabetic? no  Interpreter Needed?: No  Information entered by :: NAllen LPN   Activities of Daily Living    04/07/2022    8:17 AM 06/28/2021    9:31 AM  In your present  state of health, do you have any difficulty performing the following activities:  Hearing? 0 0  Vision? 0 1  Difficulty concentrating or making decisions? 1 1  Walking or climbing stairs? 0 0  Dressing or bathing? 0 0  Doing errands, shopping? 0 0  Preparing Food and eating ? N   Using the Toilet? N   In the past six months, have you  accidently leaked urine? N   Do you have problems with loss of bowel control? N   Managing your Medications? N   Managing your Finances? N   Housekeeping or managing your Housekeeping? N     Patient Care Team: [name], FNP as PCP - General (Family Medicine)  Indicate any recent Medical Services you may have received from other than [facility] providers in the past year (date may be approximate).     Assessment:   This is a routine wellness examination for [place].  Hearing/Vision screen Vision Screening - Comments:: No regular eye exams, [facility]  Dietary issues and exercise activities discussed: Current Exercise Habits: Home exercise routine, Type of exercise: walking, Time (Minutes): 60, Frequency (Times/Week): 7, Weekly Exercise (Minutes/Week): 420   Goals Addressed             This Visit's Progress    Patient Stated       04/07/2022, no goals       Depression Screen    04/07/2022    8:17 AM 06/28/2021    9:31 AM 06/14/2020   10:19 AM 12/10/2019    9:29 AM  PHQ 2/9 Scores  PHQ - 2 Score 0 0 4 0  PHQ- 9 Score   13     Fall Risk    04/07/2022    8:16 AM 01/09/2022    7:48 AM  [place] in the past year? 0 0  Number falls in past yr: 0 0  Injury with Fall? 0 0  Risk for fall due to : Medication side effect   Follow up Falls prevention discussed;Falls evaluation completed;Education provided     FALL RISK PREVENTION PERTAINING TO THE HOME:  Any stairs in or around the home? Yes  If so, are there any without handrails? No  Home free of loose throw rugs in walkways, pet beds, electrical cords, etc? Yes  Adequate  lighting in your home to reduce risk of falls? Yes   ASSISTIVE DEVICES UTILIZED TO PREVENT FALLS:  Life alert? No  Use of a cane, walker or w/c? No  Grab bars in the bathroom? No  Shower chair or bench in shower? No  Elevated toilet seat or a handicapped toilet? No   TIMED UP AND GO:  Was the test performed? No .       Cognitive Function:      01/09/2022    9:00 AM  Montreal Cognitive Assessment   Visuospatial/ Executive (0/5) 5  Naming (0/3) 3  Attention: Read list of digits (0/2) 2  Attention: Read list of letters (0/1) 1  Attention: Serial 7 subtraction starting at 100 (0/3) 3  Language: Repeat phrase (0/2) 2  Language : Fluency (0/1) 1  Abstraction (0/2) 1  Delayed Recall (0/5) 5  Orientation (0/6) 6  Total 29  Adjusted Score (based on education) 29      04/07/2022    8:19 AM  6CIT Screen  What Year? 0 points  What month? 0 points  What time? 0 points  Count back from 20 0 points  Months in reverse 0 points  Repeat phrase 0 points  Total Score 0 points    Immunizations Immunization History  Administered Date(s) Administered   Fluad Quad(high Dose 65+) 05/07/2021   PFIZER(Purple Top)SARS-COV-2 Vaccination 10/16/2019, 11/06/2019, 06/16/2020, 12/13/2020   Tdap 06/14/2020    TDAP status: Up to date  Flu Vaccine status: Due, Education has been provided regarding the importance of this vaccine. Advised may receive  this vaccine at local pharmacy or Health Dept. Aware to provide a copy of the vaccination record if obtained from local pharmacy or Health Dept. Verbalized acceptance and understanding.  Pneumococcal vaccine status: Declined,  Education has been provided regarding the importance of this vaccine but patient still declined. Advised may receive this vaccine at local pharmacy or Health Dept. Aware to provide a copy of the vaccination record if obtained from local pharmacy or Health Dept. Verbalized acceptance and understanding.   Covid-19 vaccine status:  Completed vaccines  Qualifies for Shingles Vaccine? Yes   Zostavax completed No   Shingrix Completed?: No.    Education has been provided regarding the importance of this vaccine. Patient has been advised to call insurance company to determine out of pocket expense if they have not yet received this vaccine. Advised may also receive vaccine at local pharmacy or Health Dept. Verbalized acceptance and understanding.  Screening Tests Health Maintenance  Topic Date Due   Zoster Vaccines- Shingrix (1 of 2) Never done   DEXA SCAN  Never done   COVID-19 Vaccine (5 - Pfizer risk series) 02/07/2021   INFLUENZA VACCINE  03/07/2022   Pneumonia Vaccine 6+ Years old (1 - PCV) 08/09/2022 (Originally 12/13/2020)   COLON CANCER SCREENING ANNUAL FOBT  04/06/2023   MAMMOGRAM  09/17/2023   TETANUS/TDAP  06/14/2030   Hepatitis C Screening  Completed   HPV VACCINES  Aged Out   COLONOSCOPY (Pts 45-83yr Insurance coverage will need to be confirmed)  Discontinued    Health Maintenance  Health Maintenance Due  Topic Date Due   Zoster Vaccines- Shingrix (1 of 2) Never done   DEXA SCAN  Never done   COVID-19 Vaccine (5 - Pfizer risk series) 02/07/2021   INFLUENZA VACCINE  03/07/2022    Colorectal cancer screening: Type of screening: Colonoscopy. Completed 04/05/2022. Repeat every 1 years  Mammogram status: Completed 09/16/2021. Repeat every year  Bone Density status: due  Lung Cancer Screening: (Low Dose CT Chest recommended if Age 298-80years, 30 pack-year currently smoking OR have quit w/in 15years.) does not qualify.   Lung Cancer Screening Referral: no  Additional Screening:  Hepatitis C Screening: does qualify; Completed 12/10/2019  Vision Screening: Recommended annual ophthalmology exams for early detection of glaucoma and other disorders of the eye. Is the patient up to date with their annual eye exam?  No  Who is the provider or what is the name of the office in which the patient attends  annual eye exams? [facility] If pt is not established with a provider, would they like to be referred to a provider to establish care? No .   Dental Screening: Recommended annual dental exams for proper oral hygiene  Community Resource Referral / Chronic Care Management: CRR required this visit?  No   CCM required this visit?  No      Plan:     I have personally reviewed and noted the following in the patient's chart:   Medical and social history Use of alcohol, tobacco or illicit drugs  Current medications and supplements including opioid prescriptions. Patient is not currently taking opioid prescriptions. Functional ability and status Nutritional status Physical activity Advanced directives List of other physicians Hospitalizations, surgeries, and ER visits in previous 12 months Vitals Screenings to include cognitive, depression, and falls Referrals and appointments  In addition, I have reviewed and discussed with patient certain preventive protocols, quality metrics, and best practice recommendations. A written personalized care plan for preventive services as well as general  preventive health recommendations were provided to patient.     Kellie Simmering, LPN   03/07/1885   Nurse Notes: none  Due to this being a virtual visit, the after visit summary with patients personalized plan was offered to patient via mail or my-chart.  Patient would like to access on my-chart

## 2022-04-07 NOTE — Patient Instructions (Signed)
Dawn Matthews , Thank you for taking time to come for your Medicare Wellness Visit. I appreciate your ongoing commitment to your health goals. Please review the following plan we discussed and let me know if I can assist you in the future.   Screening recommendations/referrals: Colonoscopy: completed 04/05/2022, due 04/06/2023 Mammogram: completed 09/16/2021, due 09/17/2022 Bone Density: due Recommended yearly ophthalmology/optometry visit for glaucoma screening and checkup Recommended yearly dental visit for hygiene and checkup  Vaccinations: Influenza vaccine: due Pneumococcal vaccine: due Tdap vaccine: completed 06/14/2020, due 06/14/2030 Shingles vaccine: discussed   Covid-19: 12/13/2020, 06/16/2020, 11/06/2019, 10/16/2019  Advanced directives: Advance directive discussed with you today.   Conditions/risks identified: none  Next appointment: Follow up in one year for your annual wellness visit    Preventive Care 65 Years and Older, Female Preventive care refers to lifestyle choices and visits with your health care provider that can promote health and wellness. What does preventive care include? A yearly physical exam. This is also called an annual well check. Dental exams once or twice a year. Routine eye exams. Ask your health care provider how often you should have your eyes checked. Personal lifestyle choices, including: Daily care of your teeth and gums. Regular physical activity. Eating a healthy diet. Avoiding tobacco and drug use. Limiting alcohol use. Practicing safe sex. Taking low-dose aspirin every day. Taking vitamin and mineral supplements as recommended by your health care provider. What happens during an annual well check? The services and screenings done by your health care provider during your annual well check will depend on your age, overall health, lifestyle risk factors, and family history of disease. Counseling  Your health care provider may ask you questions  about your: Alcohol use. Tobacco use. Drug use. Emotional well-being. Home and relationship well-being. Sexual activity. Eating habits. History of falls. Memory and ability to understand (cognition). Work and work Statistician. Reproductive health. Screening  You may have the following tests or measurements: Height, weight, and BMI. Blood pressure. Lipid and cholesterol levels. These may be checked every 5 years, or more frequently if you are over 82 years old. Skin check. Lung cancer screening. You may have this screening every year starting at age 21 if you have a 30-pack-year history of smoking and currently smoke or have quit within the past 15 years. Fecal occult blood test (FOBT) of the stool. You may have this test every year starting at age 38. Flexible sigmoidoscopy or colonoscopy. You may have a sigmoidoscopy every 5 years or a colonoscopy every 10 years starting at age 40. Hepatitis C blood test. Hepatitis B blood test. Sexually transmitted disease (STD) testing. Diabetes screening. This is done by checking your blood sugar (glucose) after you have not eaten for a while (fasting). You may have this done every 1-3 years. Bone density scan. This is done to screen for osteoporosis. You may have this done starting at age 19. Mammogram. This may be done every 1-2 years. Talk to your health care provider about how often you should have regular mammograms. Talk with your health care provider about your test results, treatment options, and if necessary, the need for more tests. Vaccines  Your health care provider may recommend certain vaccines, such as: Influenza vaccine. This is recommended every year. Tetanus, diphtheria, and acellular pertussis (Tdap, Td) vaccine. You may need a Td booster every 10 years. Zoster vaccine. You may need this after age 38. Pneumococcal 13-valent conjugate (PCV13) vaccine. One dose is recommended after age 72. Pneumococcal polysaccharide (PPSV23)  vaccine. One dose is recommended after age 25. Talk to your health care provider about which screenings and vaccines you need and how often you need them. This information is not intended to replace advice given to you by your health care provider. Make sure you discuss any questions you have with your health care provider. Document Released: 08/20/2015 Document Revised: 04/12/2016 Document Reviewed: 05/25/2015 Elsevier Interactive Patient Education  2017 [employer] Prevention in the Home Falls can cause injuries. They can happen to people of all ages. There are many things you can do to make your home safe and to help prevent falls. What can I do on the outside of my home? Regularly fix the edges of walkways and driveways and fix any cracks. Remove anything that might make you trip as you walk through a door, such as a raised step or threshold. Trim any bushes or trees on the path to your home. Use bright outdoor lighting. Clear any walking paths of anything that might make someone trip, such as rocks or tools. Regularly check to see if handrails are loose or broken. Make sure that both sides of any steps have handrails. Any raised decks and porches should have guardrails on the edges. Have any leaves, snow, or ice cleared regularly. Use sand or salt on walking paths during winter. Clean up any spills in your garage right away. This includes oil or grease spills. What can I do in the bathroom? Use night lights. Install grab bars by the toilet and in the tub and shower. Do not use towel bars as grab bars. Use non-skid mats or decals in the tub or shower. If you need to sit down in the shower, use a plastic, non-slip stool. Keep the floor dry. Clean up any water that spills on the floor as soon as it happens. Remove soap buildup in the tub or shower regularly. Attach bath mats securely with double-sided non-slip rug tape. Do not have throw rugs and other things on the floor that  can make you trip. What can I do in the bedroom? Use night lights. Make sure that you have a light by your bed that is easy to reach. Do not use any sheets or blankets that are too big for your bed. They should not hang down onto the floor. Have a firm chair that has side arms. You can use this for support while you get dressed. Do not have throw rugs and other things on the floor that can make you trip. What can I do in the kitchen? Clean up any spills right away. Avoid walking on wet floors. Keep items that you use a lot in easy-to-reach places. If you need to reach something above you, use a strong step stool that has a grab bar. Keep electrical cords out of the way. Do not use floor polish or wax that makes floors slippery. If you must use wax, use non-skid floor wax. Do not have throw rugs and other things on the floor that can make you trip. What can I do with my stairs? Do not leave any items on the stairs. Make sure that there are handrails on both sides of the stairs and use them. Fix handrails that are broken or loose. Make sure that handrails are as long as the stairways. Check any carpeting to make sure that it is firmly attached to the stairs. Fix any carpet that is loose or worn. Avoid having throw rugs at the top or bottom of  the stairs. If you do have throw rugs, attach them to the floor with carpet tape. Make sure that you have a light switch at the top of the stairs and the bottom of the stairs. If you do not have them, ask someone to add them for you. What else can I do to help prevent falls? Wear shoes that: Do not have high heels. Have rubber bottoms. Are comfortable and fit you well. Are closed at the toe. Do not wear sandals. If you use a stepladder: Make sure that it is fully opened. Do not climb a closed stepladder. Make sure that both sides of the stepladder are locked into place. Ask someone to hold it for you, if possible. Clearly mark and make sure that you  can see: Any grab bars or handrails. First and last steps. Where the edge of each step is. Use tools that help you move around (mobility aids) if they are needed. These include: Canes. Walkers. Scooters. Crutches. Turn on the lights when you go into a dark area. Replace any light bulbs as soon as they burn out. Set up your furniture so you have a clear path. Avoid moving your furniture around. If any of your floors are uneven, fix them. If there are any pets around you, be aware of where they are. Review your medicines with your doctor. Some medicines can make you feel dizzy. This can increase your chance of falling. Ask your doctor what other things that you can do to help prevent falls. This information is not intended to replace advice given to you by your health care provider. Make sure you discuss any questions you have with your health care provider. Document Released: 05/20/2009 Document Revised: 12/30/2015 Document Reviewed: 08/28/2014 Elsevier Interactive Patient Education  2017 [employer].

## 2022-04-18 ENCOUNTER — Ambulatory Visit: Payer: Medicare Other | Admitting: Internal Medicine

## 2022-06-12 ENCOUNTER — Encounter: Payer: Medicare Other | Admitting: Family Medicine

## 2022-07-06 ENCOUNTER — Ambulatory Visit (INDEPENDENT_AMBULATORY_CARE_PROVIDER_SITE_OTHER): Payer: Medicare Other | Admitting: Primary Care

## 2022-07-06 ENCOUNTER — Encounter: Payer: Self-pay | Admitting: Primary Care

## 2022-07-06 VITALS — BP 110/70 | HR 69 | Temp 98.4°F | Ht 64.0 in | Wt 144.4 lb

## 2022-07-06 DIAGNOSIS — R051 Acute cough: Secondary | ICD-10-CM | POA: Insufficient documentation

## 2022-07-06 MED ORDER — AZITHROMYCIN 250 MG PO TABS: ORAL_TABLET | ORAL | 0 refills | Status: DC

## 2022-07-06 NOTE — Assessment & Plan Note (Signed)
Unclear etiology, but I do believe that both sets of symptoms are related.   Given presentation, coupled with duration of symptoms, will treat for presumed bacterial cause.  PCN allergy. Start Azithromycin antibiotics for infection. Take 2 tablets by mouth today, then 1 tablet daily for 4 additional days.  Return precautions provided.

## 2022-07-06 NOTE — Progress Notes (Signed)
Subjective:    Patient ID: Dawn Matthews, female    DOB: [birth year], 66 y.o.   MRN: [id number]  Sore Throat  Associated symptoms include headaches and shortness of breath. Pertinent negatives include no congestion.  Headache  Pertinent negatives include no fever or sore throat.  Shortness of Breath Associated symptoms include headaches. Pertinent negatives include no fever or sore throat.    Dawn Matthews is a very pleasant 66 y.o. female patient of [facility] with a history of prediabetes, chronic fatigue, polyarthralgia who presents today to discuss URI symptoms.  Symptom onset 12 days ago with a sudden onset of headaches, body shaking, fatigue. She slept for a few days straight which is like like her, began to feel better but not back to herself. Three days ago she developed a sore throat, post nasal drip, and mild shortness of breath.  Today she has a mild cough, mild fatigue, shortness of breath. She has never smoked, has no history of asthma and has recently undergone testing.  She denies fevers. She took two home Covid-19 tests and both were negative. She took a dose of Mucinex PM for a few days which helped her to sleep.   She doesn't feel like herself. She is typically very active and has not felt like doing things she normally would.     Review of Systems  Constitutional:  Positive for fatigue. Negative for chills and fever.  HENT:  Positive for postnasal drip. Negative for congestion and sore throat.   Respiratory:  Positive for shortness of breath.   Neurological:  Positive for headaches.         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     Social History   Socioeconomic History   Marital status: Single    Spouse name: Not on file   Number of children: 0   Years of education: College    Highest education level: Not on file  Occupational History   Not on file  Tobacco Use   Smoking status: Never   Smokeless tobacco: Never  Vaping Use   Vaping Use: Never used  Substance and Sexual Activity   Alcohol use: Not Currently    Alcohol/week: 0.0 - 3.0 standard drinks of alcohol   Drug use: Never   Sexual activity: Not Currently  Other Topics Concern   Not on file  Social History Narrative   12/10/19   From: [place] - moved to [place] in 2019 to be near family   Living: alone    Pet: dog - german shepherd   Work: retired from [employer] and [employer]      Family: sister nearby, but does have family in [place] still      Enjoys: walking, visiting small towns, crossword puzzles, drink coffee      Exercise: walking 3-4 miles per day   Diet: likes ice cream eater, trying to eat healthier now, usually skips lunch and eats early dinner      Safety   Seat belts: Yes    Guns: No   Safe in relationships: Yes    Social Determinants of Health   Financial Resource Strain: Low Risk  (04/07/2022)   Overall Financial Resource Strain (CARDIA)    Difficulty of Paying Living Expenses: Not hard  at all  Food Insecurity: No Food Insecurity (04/07/2022)   Hunger Vital Sign    Worried About Running Out of Food in the Last Year: Never true    Ran Out of Food in the Last Year: Never true  Transportation Needs: No Transportation Needs (04/07/2022)   PRAPARE - Hydrologist (Medical): No    Lack of Transportation (Non-Medical): No  Physical Activity: Sufficiently Active (04/07/2022)   Exercise Vital Sign    Days of Exercise per Week: 7 days    Minutes of Exercise per Session: 30 min  Stress: No Stress Concern Present (04/07/2022)   [place]    Feeling of Stress : Not at all  Social Connections: Not on file  Intimate Partner Violence: Not on file    Past Surgical History:  Procedure  Laterality Date   COLONOSCOPY  09/19/2021   [facility] OF UTERUS     FOOT SURGERY  2010   POLYPECTOMY     SKIN CANCER EXCISION     basal cell   WISDOM TOOTH EXTRACTION      Family History  Problem Relation Age of Onset   Diabetes Mother    COPD Mother    Hypertension Mother    Atrial fibrillation Mother    Diabetes Father    Melanoma Sister    Hypertension Sister    Lung cancer Sister        smoker   Melanoma Brother    Diabetes Maternal Grandmother    Breast cancer Neg Hx    Colon polyps Neg Hx    Colon cancer Neg Hx    Esophageal cancer Neg Hx    Rectal cancer Neg Hx    Stomach cancer Neg Hx     Allergies  Allergen Reactions   Penicillins Rash   Codeine     Current Outpatient Medications on File Prior to Visit  Medication Sig Dispense Refill   furosemide (LASIX) 20 MG tablet Take 1 tablet (20 mg total) by mouth daily. 90 tablet 3   Multiple Vitamins-Minerals (CENTRUM SILVER PO) Take 1 tablet by mouth daily at 6 (six) AM.     potassium chloride (KLOR-CON) 10 MEQ tablet Take 1 tablet (10 mEq total) by mouth daily. Take with lasix 90 tablet 3   VITAMIN D, CHOLECALCIFEROL, PO Take 1 tablet by mouth daily at 6 (six) AM.     albuterol (VENTOLIN HFA) 108 (90 Base) MCG/ACT inhaler Inhale 2 puffs into the lungs every 4 (four) hours as needed for shortness of breath. (Patient not taking: Reported on 03/07/2022) 1 each 0   No current facility-administered medications on file prior to visit.    BP 110/70 (BP Location: Right Arm, Patient Position: Sitting, Cuff Size: Normal)   Pulse 69   Temp 98.4 F (36.9 C) (Temporal)   Ht '5\' 4"'$  (1.626 m)   Wt 144 lb 6 oz (65.5 kg)   SpO2 97%   BMI 24.78 kg/m  Objective:   Physical Exam Constitutional:      Appearance: She is ill-appearing.  HENT:     Right Ear: Tympanic membrane and ear canal normal.     Left Ear: Tympanic membrane and ear canal normal.     Nose:     Right Sinus: No maxillary sinus  tenderness or frontal sinus tenderness.     Left Sinus: No maxillary sinus tenderness or frontal sinus tenderness.  Mouth/Throat:     Pharynx: No posterior oropharyngeal erythema.  Eyes:     Conjunctiva/sclera: Conjunctivae normal.  Cardiovascular:     Rate and Rhythm: Normal rate and regular rhythm.  Pulmonary:     Effort: Pulmonary effort is normal.     Breath sounds: Examination of the left-lower field reveals wheezing. Wheezing present. No rhonchi or rales.     Comments: No cough during visit  Musculoskeletal:     Cervical back: Neck supple.  Lymphadenopathy:     Cervical: No cervical adenopathy.  Skin:    General: Skin is warm and dry.           Assessment & Plan:   Problem List Items Addressed This Visit       Other   Acute cough - Primary    Unclear etiology, but I do believe that both sets of symptoms are related.   Given presentation, coupled with duration of symptoms, will treat for presumed bacterial cause.  PCN allergy. Start Azithromycin antibiotics for infection. Take 2 tablets by mouth today, then 1 tablet daily for 4 additional days.  Return precautions provided.       Relevant Medications   azithromycin (ZITHROMAX) 250 MG tablet       Pleas Koch, NP

## 2022-08-23 ENCOUNTER — Telehealth: Payer: Self-pay | Admitting: Cardiovascular Disease

## 2022-08-23 NOTE — Telephone Encounter (Signed)
*  STAT* If patient is at the pharmacy, call can be transferred to refill team.   1. Which medications need to be refilled? (please list name of each medication and dose if known)  furosemide (LASIX) 20 MG tablet, potassium chloride (KLOR-CON) 10 MEQ tablet     2. Which pharmacy/location (including street and city if local pharmacy) is medication to be sent to? [id number] [place], [place] - [address]    3. Do they need a 30 day or 90 day supply? [address]

## 2022-08-23 NOTE — Telephone Encounter (Signed)
Good Morning,   Could you schedule this patient a visit? She was last seen by Dr. Rockey Situ 08/16/21. Thank you so much.

## 2022-08-23 NOTE — Telephone Encounter (Signed)
*  STAT* If patient is at the pharmacy, call can be transferred to refill team.   1. Which medications need to be refilled? (please list name of each medication and dose if known) potassium chloride (KLOR-CON) 10 MEQ tablet ;   2. Which pharmacy/location (including street and city if local pharmacy) is medication to be sent to? [id number] [place], [place] - [address]   3. Do they need a 30 day or 90 day supply? [address]

## 2022-08-25 MED ORDER — POTASSIUM CHLORIDE ER 10 MEQ PO TBCR: 10.0000 meq | EXTENDED_RELEASE_TABLET | Freq: Every day | ORAL | 0 refills | Status: DC

## 2022-08-27 ENCOUNTER — Other Ambulatory Visit: Payer: Self-pay | Admitting: Cardiovascular Disease

## 2022-08-28 NOTE — Telephone Encounter (Signed)
This patient is scheduled on 10/04/22 with G[facility].

## 2022-08-31 DIAGNOSIS — X32XXXA Exposure to sunlight, initial encounter: Secondary | ICD-10-CM | POA: Diagnosis not present

## 2022-08-31 DIAGNOSIS — D2272 Melanocytic nevi of left lower limb, including hip: Secondary | ICD-10-CM | POA: Diagnosis not present

## 2022-08-31 DIAGNOSIS — L820 Inflamed seborrheic keratosis: Secondary | ICD-10-CM | POA: Diagnosis not present

## 2022-08-31 DIAGNOSIS — L538 Other specified erythematous conditions: Secondary | ICD-10-CM | POA: Diagnosis not present

## 2022-08-31 DIAGNOSIS — Z85828 Personal history of other malignant neoplasm of skin: Secondary | ICD-10-CM | POA: Diagnosis not present

## 2022-08-31 DIAGNOSIS — D2262 Melanocytic nevi of left upper limb, including shoulder: Secondary | ICD-10-CM | POA: Diagnosis not present

## 2022-08-31 DIAGNOSIS — L570 Actinic keratosis: Secondary | ICD-10-CM | POA: Diagnosis not present

## 2022-08-31 DIAGNOSIS — D2261 Melanocytic nevi of right upper limb, including shoulder: Secondary | ICD-10-CM | POA: Diagnosis not present

## 2022-09-24 ENCOUNTER — Other Ambulatory Visit: Payer: Self-pay | Admitting: Cardiovascular Disease

## 2022-10-04 ENCOUNTER — Ambulatory Visit: Payer: Medicare HMO | Attending: Cardiovascular Disease | Admitting: Cardiovascular Disease

## 2022-10-04 ENCOUNTER — Encounter: Payer: Self-pay | Admitting: Cardiovascular Disease

## 2022-10-04 VITALS — BP 110/80 | HR 58 | Ht 63.5 in | Wt 146.5 lb

## 2022-10-04 DIAGNOSIS — I272 Pulmonary hypertension, unspecified: Secondary | ICD-10-CM

## 2022-10-04 DIAGNOSIS — R0602 Shortness of breath: Secondary | ICD-10-CM

## 2022-10-04 DIAGNOSIS — R079 Chest pain, unspecified: Secondary | ICD-10-CM | POA: Diagnosis not present

## 2022-10-04 DIAGNOSIS — I071 Rheumatic tricuspid insufficiency: Secondary | ICD-10-CM | POA: Diagnosis not present

## 2022-10-04 DIAGNOSIS — R7303 Prediabetes: Secondary | ICD-10-CM

## 2022-10-04 MED ORDER — POTASSIUM CHLORIDE ER 10 MEQ PO TBCR: 10.0000 meq | EXTENDED_RELEASE_TABLET | Freq: Every day | ORAL | 3 refills | Status: DC

## 2022-10-04 MED ORDER — FUROSEMIDE 20 MG PO TABS: 20.0000 mg | ORAL_TABLET | Freq: Every day | ORAL | 3 refills | Status: DC

## 2022-10-04 NOTE — Patient Instructions (Addendum)
Medication Instructions:  Lasix 20 daily with extra as needed in the afternoon as needed With extra potassium   If you need a refill on your cardiac medications before your next appointment, please call your pharmacy.   Lab work: No new labs needed  Testing/Procedures: No new testing needed  Follow-Up: At [facility], you and your health needs are our priority.  As part of our continuing mission to provide you with exceptional heart care, we have created designated Provider Care Teams.  These Care Teams include your primary Cardiologist (physician) and Advanced Practice Providers (APPs -  Physician Assistants and Nurse Practitioners) who all work together to provide you with the care you need, when you need it.  You will need a follow up appointment in 12 months  Providers on your designated Care Team:   Murray Hodgkins, NP Christell Faith, PA-C Cadence Kathlen Mody, [place]  COVID-19 Vaccine Information can be found at: [email or web address] For questions related to vaccine distribution or appointments, please email [email or web address] or call [phone].

## 2022-10-04 NOTE — Progress Notes (Signed)
Cardiology Office Note  Date:  10/04/2022   ID:  Dawn Matthews, DOB 1956/01/06, MRN [id number]  PCP:  [name], FNP   Chief Complaint  Patient presents with   Follow-up    Patient c/o having some new spells of shortness of breath with walking and when lying in bed, feels some wheezing.     HPI:  [name] is a 67 yo woman with PMH o   Chronic shortness of breath, asthma  followed by pulmonary, concern for COPD Non-smoker Who presents for follow-up of her abnormal ekg, PFO Chest pain intermittently, shortness of breath, pulmonary HTN  On office visit 1 year ago was having significant shortness of breath Previously seen by pulmonary, PFTs unrevealing Virus, "back to back" starting Nov 2022 Steroids x2, ABX  Echocardiogram February 2023 Normal LV function, normal RV function Moderately elevated right heart pressures,  tricuspid valve regurgitation  Was started on Lasix daily with potassium, since then reports her breathing has improved She has had 3 sporadic episodes of a gurgling sensation in her chest when supine in bed, last one month ago Does not happen on a regular basis  Very active at baseline, good exercise tolerance Walks 4 miles a day minimum, Minimal SOB when not talking Feels well in general  Reports poor sleep hygiene, hit or miss depending on the night  EKG personally reviewed by myself on todays visit NSR rate 58 bpm, no ST or T wave changes  PMH:   has a past medical history of Anxiety, Basal cell carcinoma, Hyperglycemia, Osteopenia, and 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  PSH:    Past Surgical History:  Procedure Laterality Date   COLONOSCOPY  09/19/2021   [facility]   DILATION AND CURETTAGE OF UTERUS     FOOT SURGERY  2010   POLYPECTOMY     SKIN CANCER EXCISION     basal cell   WISDOM TOOTH EXTRACTION      Current Outpatient Medications  Medication Sig Dispense Refill   albuterol (VENTOLIN HFA) 108 (90 Base)  MCG/ACT inhaler Inhale 2 puffs into the lungs every 4 (four) hours as needed for shortness of breath. 1 each 0   Multiple Vitamins-Minerals (CENTRUM SILVER PO) Take 1 tablet by mouth daily at 6 (six) AM.     VITAMIN D, CHOLECALCIFEROL, PO Take 1 tablet by mouth daily at 6 (six) AM.     azithromycin (ZITHROMAX) 250 MG tablet Take 2 tablets by mouth today, then 1 tablet daily for 4 additional days. (Patient not taking: Reported on 10/04/2022) 6 tablet 0   furosemide (LASIX) 20 MG tablet Take 1 tablet (20 mg total) by mouth daily. With extra as needed after lunch 145 tablet 3   potassium chloride (KLOR-CON) 10 MEQ tablet Take 1 tablet (10 mEq total) by mouth daily. With extra as needed 145 tablet 3   No current facility-administered medications for this visit.     Allergies:   Penicillins and Codeine   Social History:  The patient  reports that she has never smoked. She has never used smokeless tobacco. She reports that she does not currently use alcohol. She reports that she does not use drugs.   Family History:   family history includes Atrial fibrillation in her mother; COPD in her mother; Diabetes in her father, maternal grandmother, and mother; Hypertension in her mother and sister; Lung cancer in her sister; Melanoma in her brother and sister.    Review of Systems: Review of Systems  Constitutional: Negative.   HENT: Negative.    Respiratory: Negative.    Cardiovascular: Negative.   Gastrointestinal: Negative.   Musculoskeletal: Negative.   Neurological: Negative.   Psychiatric/Behavioral: Negative.    All other systems reviewed and are negative.   PHYSICAL EXAM: VS:  BP 110/80 (BP Location: Left Arm, Patient Position: Sitting, Cuff Size: Normal)   Pulse (!) 58   Ht 5' 3.5" (1.613 m)   Wt 146 lb 8 oz (66.5 kg)   SpO2 97%   BMI 25.54 kg/m  , BMI Body mass index is 25.54 kg/m. GEN: Well nourished, well developed, in no acute distress HEENT: normal Neck: no JVD, carotid  bruits, or masses Cardiac: RRR; no murmurs, rubs, or gallops,no edema  Respiratory:  clear to auscultation bilaterally, normal work of breathing GI: soft, nontender, nondistended, + BS MS: no deformity or atrophy Skin: warm and dry, no rash Neuro:  Strength and sensation are intact Psych: euthymic mood, full affect   Recent Labs: 12/27/2021: BUN 14; Creatinine, Ser 0.82; Hemoglobin 13.9; Platelets 313.0; Potassium 4.4; Sodium 138; TSH 1.35    Lipid Panel Lab Results  Component Value Date   CHOL 172 12/10/2019   HDL 65.90 12/10/2019   LDLCALC 95 12/10/2019   TRIG 54.0 12/10/2019      Wt Readings from Last 3 Encounters:  10/04/22 146 lb 8 oz (66.5 kg)  07/06/22 144 lb 6 oz (65.5 kg)  04/07/22 144 lb (65.3 kg)     ASSESSMENT AND PLAN:  Problem List Items Addressed This Visit       Cardiology Problems   Tricuspid regurgitation   Relevant Medications   furosemide (LASIX) 20 MG tablet     Other   Prediabetes   Relevant Orders   EKG 12-Lead   Other Visit Diagnoses     Pulmonary hypertension, unspecified ([facility])    -  Primary   Relevant Medications   furosemide (LASIX) 20 MG tablet   Chest pain of uncertain etiology       Relevant Orders   EKG 12-Lead   Shortness of breath       Relevant Orders   EKG 12-Lead      Shortness of breath/pulmonary hypertension Moderately elevated right heart pressures noted on echocardiogram in early 2023 with tricuspid valve regurgitation She reports symptoms improved 1 year later, continues to take Lasix with potassium daily Rare episodes of gurgling, unclear etiology, unable to exclude fluid retention Echocardiogram offered when she is ready for reevaluation of right heart pressures and tricuspid valve regurgitation -Recommended Lasix daily, extra Lasix in the afternoon for any increased shortness of breath or gurgling sensation  PFO Likely small PFO noted on echocardiogram, bubble study positive Likely unrelated to findings  above  Tricuspid valve regurgitation Noted on echocardiogram in 2023 No significant murmur appreciated on exam Repeat echocardiogram when she chooses for new baseline on Lasix  Signed, Esmond Plants, M.D., Ph.D. [place], [place]

## 2023-01-11 IMAGING — DX DG CERVICAL SPINE COMPLETE 4+V
6 series · 6 of 6 positions shown · non-contrast
Comparison: None Available.

CLINICAL DATA: Cervical spine pain.  No known injury.

EXAM:
CERVICAL SPINE - COMPLETE 4+ VIEW

[cervical spine ap]
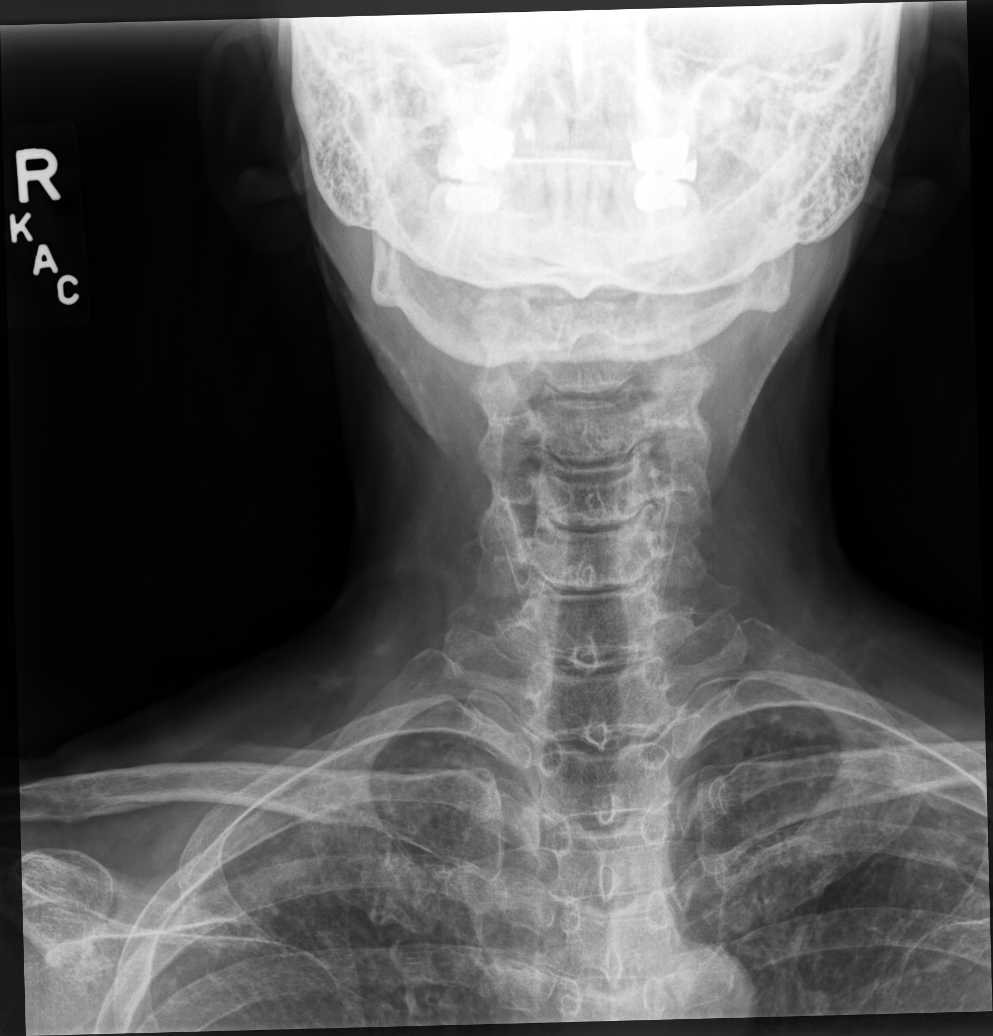

[cervical spine ap open mouth]
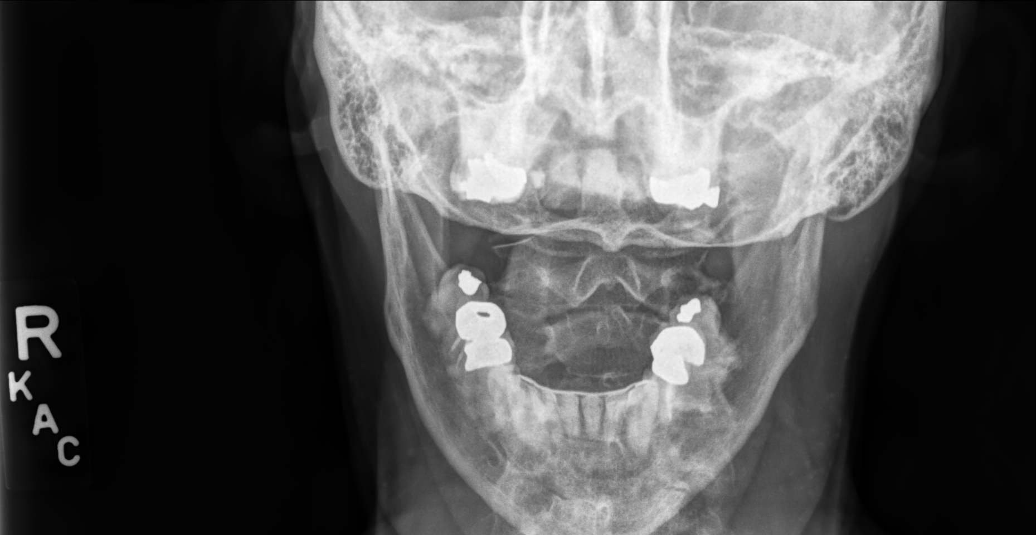

[cervical spine (swimmers) lat]
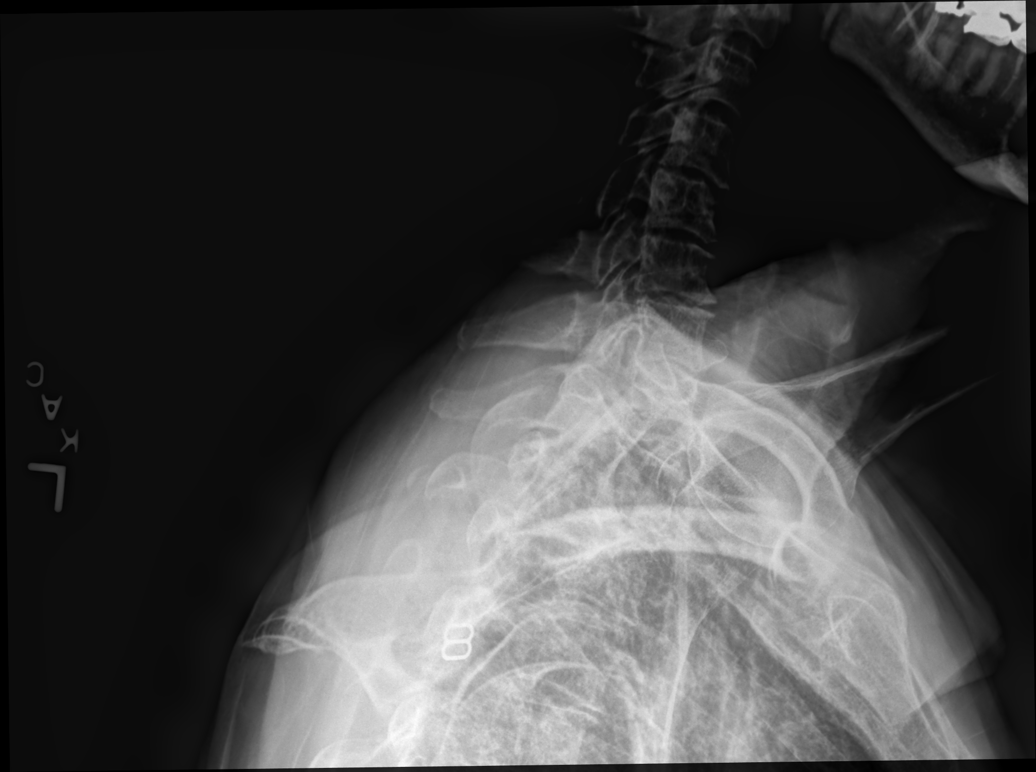

[cervical spine lat]
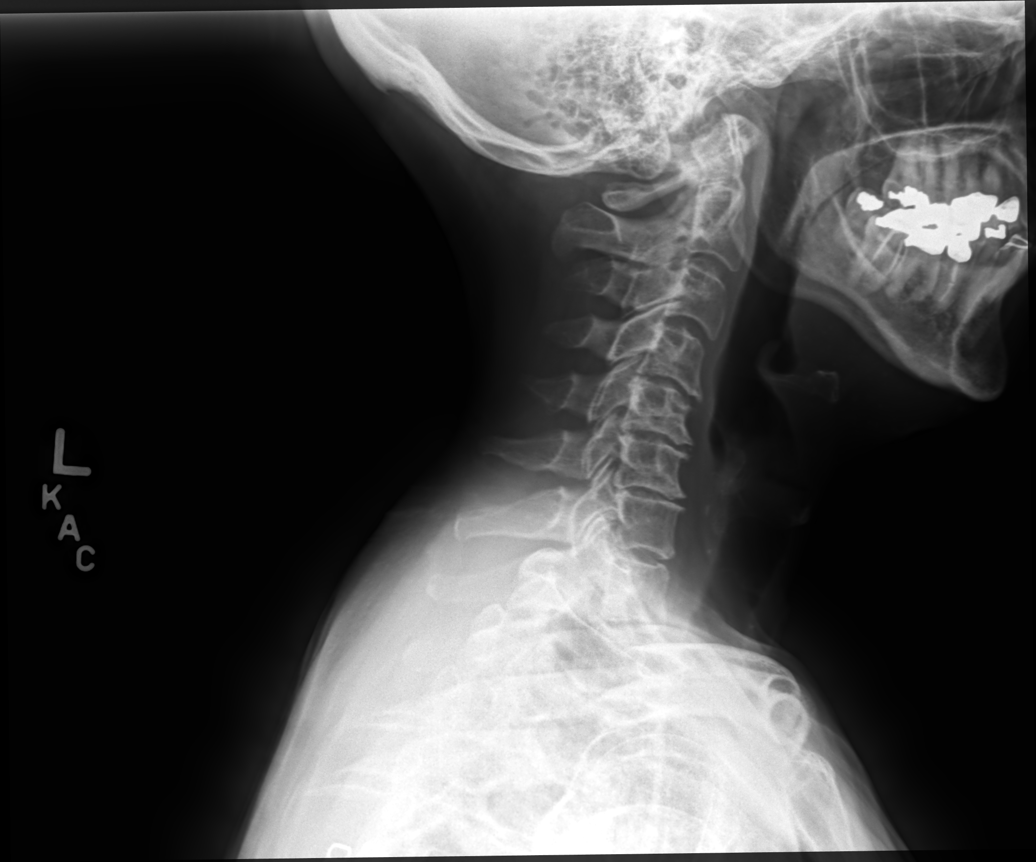

[cervical spine oblique lmo (1 of 2)]
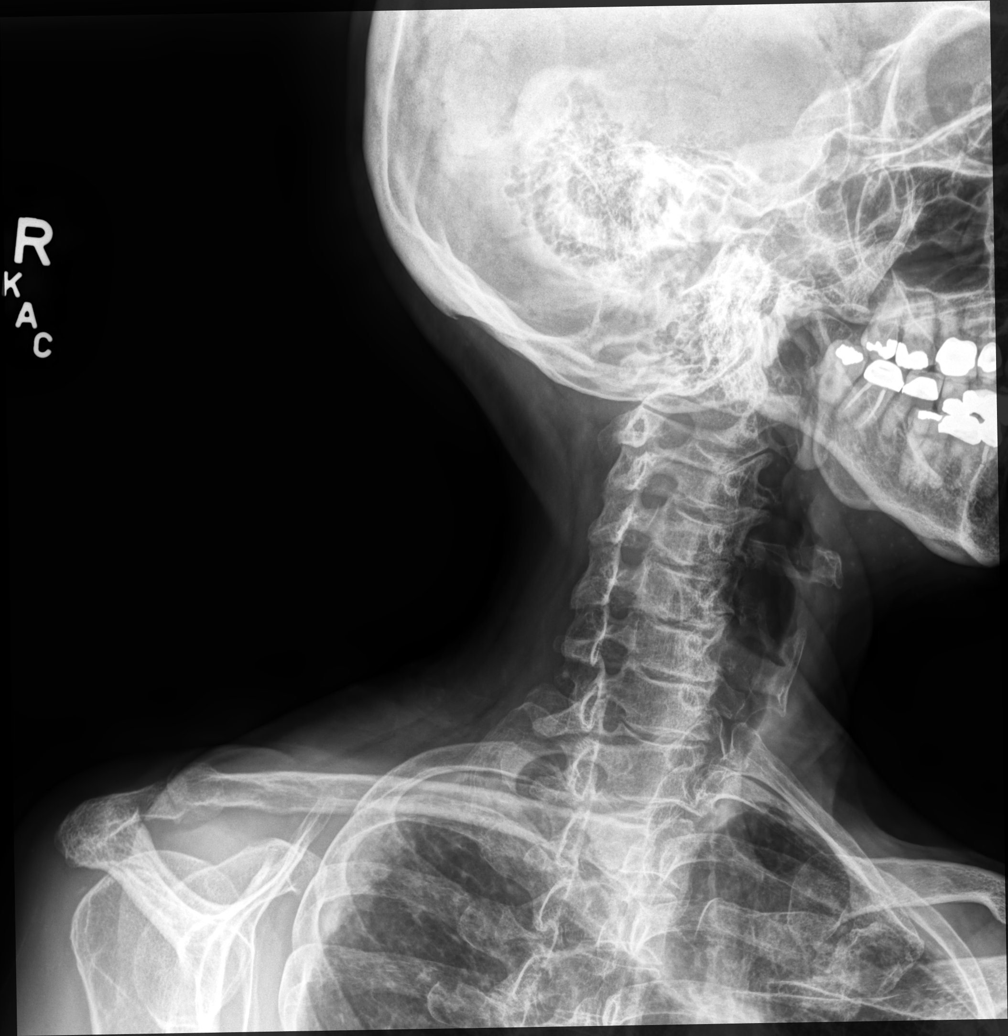

[cervical spine oblique lmo (2 of 2)]
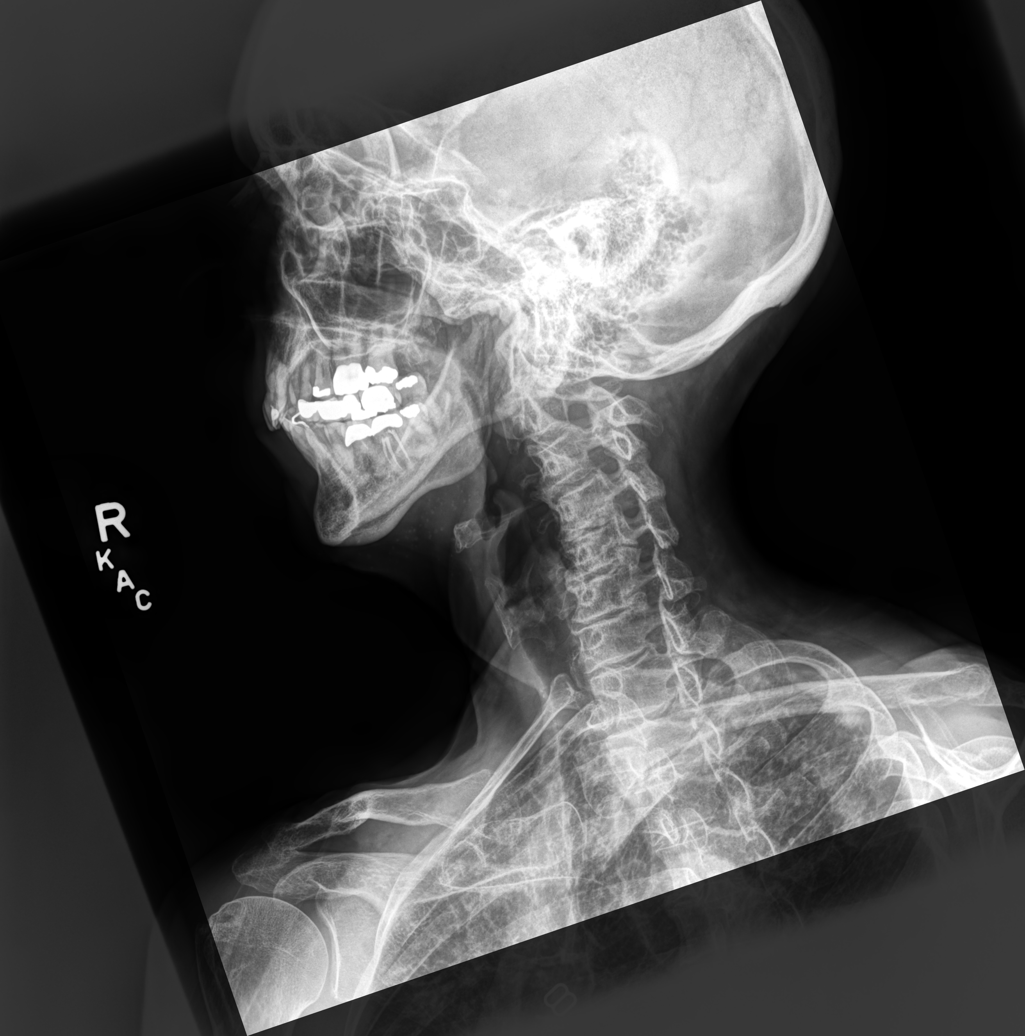

[6 of 6 positions shown; findings below may reference images not displayed]

FINDINGS: Reversal of the normal cervical lordosis. C4-5, C5-6 and C6-7
degenerative disc disease. Preservation of vertebral body heights.
Prevertebral soft tissues are unremarkable. No acute process.
IMPRESSION: Multilevel degenerative disc disease.

## 2023-02-19 DIAGNOSIS — H2513 Age-related nuclear cataract, bilateral: Secondary | ICD-10-CM | POA: Diagnosis not present

## 2023-03-01 DIAGNOSIS — L578 Other skin changes due to chronic exposure to nonionizing radiation: Secondary | ICD-10-CM | POA: Diagnosis not present

## 2023-03-01 DIAGNOSIS — L538 Other specified erythematous conditions: Secondary | ICD-10-CM | POA: Diagnosis not present

## 2023-03-01 DIAGNOSIS — L298 Other pruritus: Secondary | ICD-10-CM | POA: Diagnosis not present

## 2023-03-01 DIAGNOSIS — L820 Inflamed seborrheic keratosis: Secondary | ICD-10-CM | POA: Diagnosis not present

## 2023-03-01 DIAGNOSIS — L570 Actinic keratosis: Secondary | ICD-10-CM | POA: Diagnosis not present

## 2023-03-01 DIAGNOSIS — Z85828 Personal history of other malignant neoplasm of skin: Secondary | ICD-10-CM | POA: Diagnosis not present

## 2023-03-01 DIAGNOSIS — X32XXXA Exposure to sunlight, initial encounter: Secondary | ICD-10-CM | POA: Diagnosis not present

## 2023-03-01 DIAGNOSIS — R202 Paresthesia of skin: Secondary | ICD-10-CM | POA: Diagnosis not present

## 2023-03-01 DIAGNOSIS — L821 Other seborrheic keratosis: Secondary | ICD-10-CM | POA: Diagnosis not present

## 2023-03-22 ENCOUNTER — Encounter (INDEPENDENT_AMBULATORY_CARE_PROVIDER_SITE_OTHER): Payer: Self-pay

## 2023-04-12 ENCOUNTER — Ambulatory Visit (INDEPENDENT_AMBULATORY_CARE_PROVIDER_SITE_OTHER): Payer: Medicare HMO | Admitting: Family

## 2023-04-12 ENCOUNTER — Encounter: Payer: Self-pay | Admitting: Family

## 2023-04-12 VITALS — BP 108/68 | HR 66 | Temp 97.9°F | Ht 63.5 in

## 2023-04-12 DIAGNOSIS — Z0000 Encounter for general adult medical examination without abnormal findings: Secondary | ICD-10-CM | POA: Diagnosis not present

## 2023-04-12 DIAGNOSIS — R413 Other amnesia: Secondary | ICD-10-CM

## 2023-04-12 DIAGNOSIS — R1013 Epigastric pain: Secondary | ICD-10-CM | POA: Insufficient documentation

## 2023-04-12 DIAGNOSIS — M8580 Other specified disorders of bone density and structure, unspecified site: Secondary | ICD-10-CM | POA: Diagnosis not present

## 2023-04-12 DIAGNOSIS — Z131 Encounter for screening for diabetes mellitus: Secondary | ICD-10-CM | POA: Diagnosis not present

## 2023-04-12 DIAGNOSIS — R142 Eructation: Secondary | ICD-10-CM | POA: Diagnosis not present

## 2023-04-12 DIAGNOSIS — R7303 Prediabetes: Secondary | ICD-10-CM | POA: Diagnosis not present

## 2023-04-12 DIAGNOSIS — Z1231 Encounter for screening mammogram for malignant neoplasm of breast: Secondary | ICD-10-CM

## 2023-04-12 DIAGNOSIS — Z23 Encounter for immunization: Secondary | ICD-10-CM | POA: Diagnosis not present

## 2023-04-12 DIAGNOSIS — R1010 Upper abdominal pain, unspecified: Secondary | ICD-10-CM | POA: Diagnosis not present

## 2023-04-12 DIAGNOSIS — Z136 Encounter for screening for cardiovascular disorders: Secondary | ICD-10-CM | POA: Diagnosis not present

## 2023-04-12 DIAGNOSIS — Z780 Asymptomatic menopausal state: Secondary | ICD-10-CM

## 2023-04-12 DIAGNOSIS — Z0001 Encounter for general adult medical examination with abnormal findings: Secondary | ICD-10-CM | POA: Insufficient documentation

## 2023-04-12 LAB — LIPID PANEL
Cholesterol: 171 mg/dL (ref 0–200)
HDL: 73.2 mg/dL (ref >39.00)
LDL Cholesterol: 85 mg/dL (ref 0–99)
NonHDL: 97.6
Total CHOL/HDL Ratio: 2
Triglycerides: 62 mg/dL (ref 0.0–149.0)
VLDL: 12.4 mg/dL (ref 0.0–40.0)

## 2023-04-12 LAB — COMPREHENSIVE METABOLIC PANEL
ALT: 21 U/L (ref 0–35)
AST: 31 U/L (ref 0–37)
Albumin: 4.1 g/dL (ref 3.5–5.2)
Alkaline Phosphatase: 56 U/L (ref 39–117)
BUN: 13 mg/dL (ref 6–23)
CO2: 30 meq/L (ref 19–32)
Calcium: 9.6 mg/dL (ref 8.4–10.5)
Chloride: 101 meq/L (ref 96–112)
Creatinine, Ser: 0.83 mg/dL (ref 0.40–1.20)
GFR: 73 mL/min (ref >60.00)
Glucose, Bld: 86 mg/dL (ref 70–99)
Potassium: 4 meq/L (ref 3.5–5.1)
Sodium: 137 meq/L (ref 135–145)
Total Bilirubin: 0.7 mg/dL (ref 0.2–1.2)
Total Protein: 7.2 g/dL (ref 6.0–8.3)

## 2023-04-12 LAB — HEMOGLOBIN A1C: Hgb A1c MFr Bld: 6 % (ref 4.6–6.5)

## 2023-04-12 MED ORDER — OMEPRAZOLE 20 MG PO CPDR: 20.0000 mg | DELAYED_RELEASE_CAPSULE | Freq: Every day | ORAL | 0 refills | Status: DC

## 2023-04-12 NOTE — Assessment & Plan Note (Signed)
Overdue for neurology appt Unable to do and cancelled MRI due to anxiety and never followed back up.  Pt states will call to make appt as ongoing memory loss, states stable however

## 2023-04-12 NOTE — Assessment & Plan Note (Addendum)
Chronic, intermittent.  U/s abdomen ordered and pending results.  Ddx: cholecystitis (less likely) abd mass, GERD  Cmp to assess liver function  Trial omeprazole as well 20 mg x 14 days.

## 2023-04-12 NOTE — Progress Notes (Signed)
Subjective:  Patient ID: Dawn Matthews, female    DOB: 09-Oct-1955  Age: 67 y.o. MRN: [id number]  Patient Care Team: Mort Sawyers, FNP as PCP - General (Family Medicine)   CC:  Chief Complaint  Patient presents with   Annual Exam    HPI Dawn Matthews is a 67 y.o. female who presents today for an annual physical exam. She reports consuming a general diet.  Walks daily  She generally feels well. She reports sleeping poorly. She does not have additional problems to discuss today.   Vision:Within last year Dental:Receives regular dental care  Mammogram: 09/16/21 Colonoscopy: 03/16/22 repeat x one year, overdue.  Bone density scan:'years ago'  Flu vaccine, would like to get today  Pneumonia, never had this prior will get today.  Shingles: never had open to this.  Flu vaccine: wants today   Pt is with acute concerns.  While in bed if she goes to lean over she will feel a gripping sensation in her right upper abdomen that will at times radiate to epigastric area. Prior to turning she is typically sitting upright in the bed and or sitting reading.  This does not happen throughout the day. Denies heartburn but does find she belches often. This is not daily but can occur a few times a week. Does have a chronic dry cough.   Does report poor sleep which is normal for her. She has been sleeping quite well for the last few nights however. Has tried melatonin in the past but would only work for a few days then not overly helpful. Was given an RX for trazodone in the past but had never tried it.   Pulmonary hypertension and tricuspid valve insufficiency, followed by Dr. Julien Nordmann. Last visit 10/04/22. H/o PFO echo, bubble study was positive.  Is taking lasix daily with potassium. Asymptomatic today per pt.    Advanced Directives Patient does not have advanced directives    DEPRESSION SCREENING    04/12/2023   12:12 PM 04/07/2022    8:17 AM 06/28/2021    9:31 AM 06/14/2020   10:19  AM 12/10/2019    9:29 AM  PHQ 2/9 Scores  PHQ - 2 Score 0 0 0 4 0  PHQ- 9 Score 0   13      ROS: Negative unless specifically indicated above in HPI.    Current Outpatient Medications:    albuterol (VENTOLIN HFA) 108 (90 Base) MCG/ACT inhaler, Inhale 2 puffs into the lungs every 4 (four) hours as needed for shortness of breath., Disp: 1 each, Rfl: 0   furosemide (LASIX) 20 MG tablet, Take 1 tablet (20 mg total) by mouth daily. With extra as needed after lunch, Disp: 145 tablet, Rfl: 3   Multiple Vitamins-Minerals (CENTRUM SILVER PO), Take 1 tablet by mouth daily at 6 (six) AM., Disp: , Rfl:    omeprazole (PRILOSEC) 20 MG capsule, Take 1 capsule (20 mg total) by mouth daily for 14 days., Disp: 14 capsule, Rfl: 0   potassium chloride (KLOR-CON) 10 MEQ tablet, Take 1 tablet (10 mEq total) by mouth daily. With extra as needed, Disp: 145 tablet, Rfl: 3   VITAMIN D, CHOLECALCIFEROL, PO, Take 1 tablet by mouth daily at 6 (six) AM., Disp: , Rfl:     Objective:    BP 108/68 (BP Location: Left Arm, Patient Position: Sitting, Cuff Size: Normal)   Pulse 66   Temp 97.9 F (36.6 C) (Temporal)   Ht 5' 3.5" (1.613 m)   SpO2 95%  BMI 25.54 kg/m   BP Readings from Last 3 Encounters:  04/12/23 108/68  10/04/22 110/80  07/06/22 110/70      Physical Exam Constitutional:      General: She is not in acute distress.    Appearance: Normal appearance. She is normal weight. She is not ill-appearing.  HENT:     Head: Normocephalic.     Right Ear: Tympanic membrane normal.     Left Ear: Tympanic membrane normal.     Nose: Nose normal.     Mouth/Throat:     Mouth: Mucous membranes are moist.  Eyes:     Extraocular Movements: Extraocular movements intact.     Pupils: Pupils are equal, round, and reactive to light.  Cardiovascular:     Rate and Rhythm: Normal rate and regular rhythm.  Pulmonary:     Effort: Pulmonary effort is normal.     Breath sounds: Normal breath sounds.  Abdominal:      General: Abdomen is flat. Bowel sounds are normal. There is no distension.     Palpations: Abdomen is soft. There is mass (tender protrusion right mid upper abdomen).     Tenderness: There is abdominal tenderness (upper abdomen). There is no guarding or rebound.  Musculoskeletal:        General: Normal range of motion.     Cervical back: Normal range of motion.  Skin:    General: Skin is warm.     Capillary Refill: Capillary refill takes less than 2 seconds.  Neurological:     General: No focal deficit present.     Mental Status: She is alert.  Psychiatric:        Mood and Affect: Mood normal.        Behavior: Behavior normal.        Thought Content: Thought content normal.        Judgment: Judgment normal.          Assessment & Plan:  Screening mammogram for breast cancer -     3D Screening Mammogram, Left and Right; Future  Postmenopausal -     DG Bone Density; Future  Prediabetes Assessment & Plan: Pt advised of the following: Work on a diabetic diet, try to incorporate exercise at least 20-30 a day for 3 days a week or more.    Orders: -     Hemoglobin A1c  Screening for cardiovascular condition -     Lipid panel  Screening for diabetes mellitus  Epigastric pain -     Omeprazole; Take 1 capsule (20 mg total) by mouth daily for 14 days.  Dispense: 14 capsule; Refill: 0 -     US Abdomen Complete; Future -     Comprehensive metabolic panel  Upper abdominal pain Assessment & Plan: Chronic, intermittent.  U/s abdomen ordered and pending results.  Ddx: cholecystitis (less likely) abd mass, GERD  Cmp to assess liver function  Trial omeprazole as well 20 mg x 14 days.     Orders: -     US Abdomen Complete; Future -     Comprehensive metabolic panel  Belching -     US Abdomen Complete; Future -     Comprehensive metabolic panel  Encounter for general adult medical examination with abnormal findings Assessment & Plan: Patient Counseling(The following  topics were reviewed):  Preventative care handout given to pt  Health maintenance and immunizations reviewed. Please refer to Health maintenance section. Pt advised on safe sex, wearing seatbelts in car, and proper nutrition  labwork ordered today for annual Dental health: Discussed importance of regular tooth brushing, flossing, and dental visits.   Orders: -     Pneumococcal conjugate vaccine 20-valent  Encounter for immunization -     Flu Vaccine Trivalent High Dose (Fluad)  Memory difficulties Assessment & Plan: Overdue for neurology appt Unable to do and cancelled MRI due to anxiety and never followed back up.  Pt states will call to make appt as ongoing memory loss, states stable however    Osteopenia, unspecified location Assessment & Plan: Bone density ordered pending results.         Follow-up: Return in about 3 months (around 07/12/2023) for f/u abdom pain.   Mort Sawyers, FNP

## 2023-04-12 NOTE — Assessment & Plan Note (Signed)
Bone density ordered pending results.   

## 2023-04-12 NOTE — Assessment & Plan Note (Signed)

## 2023-04-12 NOTE — Patient Instructions (Addendum)
  I have ordered imaging for you at [facility] outpatient diagnostic center for an ultrasound of the abdomen This order has been sent over for you electronically.  Please call [phone] to schedule this appointment.  ------------------------------------ Recommendations for shingles , but you need to get this in your pharmacy due to your insurance.   ------------------------------------  Call to get a colonoscopy , looks like you are overdue.   I have sent an electronic order over to your preferred location for the following:   []   2D Mammogram  [x]   3D Mammogram  [x]   Bone Density   Please give this center a call to get scheduled at your convenience.  [x]   [facility]  [address] [place] [address]  [phone]  Make sure to wear two piece  clothing  No lotions powders or deodorants the day of the appointment Make sure to bring picture ID and insurance card.  Bring list of medications you are currently taking including any supplements.   ------------------------------------ Please work on the following to help with sleep:  -Sleep only long enough to feel rested then get out of bed -Go to bed and get up at the same time every day. -Do not try to force yourself to sleep. If you can't sleep, get out of bed adn try again later. -Have coffee, tea, and other foods that have caffeine only in the morning. -Avoid alcohol -Keep your bedroom dark, cool, quiet, and free of reminders of work or other things that cause you stress -Exercise several days a week, but not right before bed -Avoid looking at phones or reading devices ("e-books") that give off light before bed. This can make it harder to fall asleep

## 2023-04-12 NOTE — Assessment & Plan Note (Signed)
Pt advised of the following: Work on a diabetic diet, try to incorporate exercise at least 20-30 a day for 3 days a week or more.   

## 2023-04-13 ENCOUNTER — Encounter: Payer: Self-pay | Admitting: *Deleted

## 2023-04-13 ENCOUNTER — Telehealth: Payer: Self-pay | Admitting: Family

## 2023-04-13 DIAGNOSIS — R0602 Shortness of breath: Secondary | ICD-10-CM

## 2023-04-13 NOTE — Telephone Encounter (Signed)
Patient called in stating that her and Tabitha discussed trazodone being sent in for her to help her sleep during her cpe,as well as the refill for albuterol (VENTOLIN HFA) 108 (90 Base) MCG/ACT inhaler    [id number] - [place], [place] - [address] Phone: [phone]  Fax: [phone]

## 2023-04-16 ENCOUNTER — Other Ambulatory Visit: Payer: Self-pay | Admitting: Family

## 2023-04-16 DIAGNOSIS — G479 Sleep disorder, unspecified: Secondary | ICD-10-CM

## 2023-04-16 MED ORDER — TRAZODONE HCL 50 MG PO TABS
25.0000 mg | ORAL_TABLET | Freq: Every evening | ORAL | 0 refills | Status: DC | PRN
Start: 2023-04-16 — End: 2023-06-23

## 2023-04-16 MED ORDER — ALBUTEROL SULFATE HFA 108 (90 BASE) MCG/ACT IN AERS: 2.0000 | INHALATION_SPRAY | RESPIRATORY_TRACT | 5 refills | Status: AC | PRN

## 2023-04-18 ENCOUNTER — Ambulatory Visit
Admission: RE | Admit: 2023-04-18 | Discharge: 2023-04-18 | Disposition: A | Discharge status: Home / Self Care | Payer: Medicare HMO | Source: Ambulatory Visit | Attending: Family | Admitting: Family

## 2023-04-18 DIAGNOSIS — R1010 Upper abdominal pain, unspecified: Secondary | ICD-10-CM | POA: Insufficient documentation

## 2023-04-18 DIAGNOSIS — K7689 Other specified diseases of liver: Secondary | ICD-10-CM | POA: Diagnosis not present

## 2023-04-18 DIAGNOSIS — R1013 Epigastric pain: Secondary | ICD-10-CM | POA: Insufficient documentation

## 2023-04-18 DIAGNOSIS — R142 Eructation: Secondary | ICD-10-CM | POA: Diagnosis not present

## 2023-04-18 DIAGNOSIS — N281 Cyst of kidney, acquired: Secondary | ICD-10-CM | POA: Diagnosis not present

## 2023-06-17 DIAGNOSIS — R69 Illness, unspecified: Secondary | ICD-10-CM | POA: Diagnosis not present

## 2023-06-22 ENCOUNTER — Observation Stay: Payer: Medicare HMO

## 2023-06-22 ENCOUNTER — Observation Stay
Admission: EM | Admit: 2023-06-22 | Discharge: 2023-06-23 | Disposition: A | Discharge status: Home / Self Care | Payer: Medicare HMO | Attending: Osteopathic Medicine | Admitting: Osteopathic Medicine

## 2023-06-22 ENCOUNTER — Emergency Department: Payer: Medicare HMO

## 2023-06-22 ENCOUNTER — Other Ambulatory Visit: Payer: Self-pay

## 2023-06-22 ENCOUNTER — Telehealth: Payer: Self-pay | Admitting: Family

## 2023-06-22 DIAGNOSIS — Z85828 Personal history of other malignant neoplasm of skin: Secondary | ICD-10-CM | POA: Diagnosis not present

## 2023-06-22 DIAGNOSIS — R5382 Chronic fatigue, unspecified: Secondary | ICD-10-CM | POA: Diagnosis present

## 2023-06-22 DIAGNOSIS — Z79899 Other long term (current) drug therapy: Secondary | ICD-10-CM | POA: Diagnosis not present

## 2023-06-22 DIAGNOSIS — R55 Syncope and collapse: Secondary | ICD-10-CM | POA: Diagnosis not present

## 2023-06-22 DIAGNOSIS — I700 Atherosclerosis of aorta: Secondary | ICD-10-CM | POA: Insufficient documentation

## 2023-06-22 DIAGNOSIS — R7989 Other specified abnormal findings of blood chemistry: Secondary | ICD-10-CM | POA: Diagnosis not present

## 2023-06-22 DIAGNOSIS — R531 Weakness: Secondary | ICD-10-CM | POA: Insufficient documentation

## 2023-06-22 DIAGNOSIS — J984 Other disorders of lung: Secondary | ICD-10-CM | POA: Diagnosis not present

## 2023-06-22 DIAGNOSIS — R7303 Prediabetes: Secondary | ICD-10-CM | POA: Diagnosis not present

## 2023-06-22 DIAGNOSIS — R0602 Shortness of breath: Secondary | ICD-10-CM | POA: Insufficient documentation

## 2023-06-22 DIAGNOSIS — H538 Other visual disturbances: Secondary | ICD-10-CM | POA: Diagnosis not present

## 2023-06-22 DIAGNOSIS — R110 Nausea: Secondary | ICD-10-CM | POA: Diagnosis not present

## 2023-06-22 DIAGNOSIS — M8580 Other specified disorders of bone density and structure, unspecified site: Secondary | ICD-10-CM | POA: Diagnosis present

## 2023-06-22 DIAGNOSIS — R61 Generalized hyperhidrosis: Secondary | ICD-10-CM | POA: Diagnosis not present

## 2023-06-22 DIAGNOSIS — R0609 Other forms of dyspnea: Secondary | ICD-10-CM | POA: Insufficient documentation

## 2023-06-22 DIAGNOSIS — R413 Other amnesia: Secondary | ICD-10-CM | POA: Diagnosis present

## 2023-06-22 DIAGNOSIS — F419 Anxiety disorder, unspecified: Secondary | ICD-10-CM | POA: Insufficient documentation

## 2023-06-22 DIAGNOSIS — R29818 Other symptoms and signs involving the nervous system: Secondary | ICD-10-CM | POA: Diagnosis not present

## 2023-06-22 LAB — BASIC METABOLIC PANEL
Anion gap: 8 (ref 5–15)
BUN: 19 mg/dL (ref 8–23)
CO2: 26 mmol/L (ref 22–32)
Calcium: 9.2 mg/dL (ref 8.9–10.3)
Chloride: 100 mmol/L (ref 98–111)
Creatinine, Ser: 0.76 mg/dL (ref 0.44–1.00)
GFR, Estimated: >60 mL/min (ref >60)
Glucose, Bld: 137 mg/dL — ABNORMAL HIGH (ref 70–99)
Potassium: 3.7 mmol/L (ref 3.5–5.1)
Sodium: 134 mmol/L — ABNORMAL LOW (ref 135–145)

## 2023-06-22 LAB — CBC
HCT: 40.2 % (ref 36.0–46.0)
Hemoglobin: 13.5 g/dL (ref 12.0–15.0)
MCH: 29.9 pg (ref 26.0–34.0)
MCHC: 33.6 g/dL (ref 30.0–36.0)
MCV: 88.9 fL (ref 80.0–100.0)
Platelets: 268 10*3/uL (ref 150–400)
RBC: 4.52 MIL/uL (ref 3.87–5.11)
RDW: 12.3 % (ref 11.5–15.5)
WBC: 7.2 10*3/uL (ref 4.0–10.5)
nRBC: 0 % (ref 0.0–0.2)

## 2023-06-22 LAB — TROPONIN I (HIGH SENSITIVITY)
Troponin I (High Sensitivity): 3 ng/L (ref <18)
Troponin I (High Sensitivity): 4 ng/L (ref <18)

## 2023-06-22 LAB — BRAIN NATRIURETIC PEPTIDE: B Natriuretic Peptide: 53 pg/mL (ref 0.0–100.0)

## 2023-06-22 LAB — D-DIMER, QUANTITATIVE: D-Dimer, Quant: 0.66 ug{FEU}/mL — ABNORMAL HIGH (ref 0.00–0.50)

## 2023-06-22 LAB — PROCALCITONIN: Procalcitonin: <0.1 ng/mL

## 2023-06-22 MED ORDER — SENNOSIDES-DOCUSATE SODIUM 8.6-50 MG PO TABS: 1.0000 | ORAL_TABLET | Freq: Every evening | ORAL | Status: DC | PRN

## 2023-06-22 MED ORDER — SODIUM CHLORIDE 0.9% FLUSH
3.0000 mL | Freq: Two times a day (BID) | INTRAVENOUS | Status: DC
  Administered 2023-06-22 – 2023-06-23 (×3): 3 mL via INTRAVENOUS

## 2023-06-22 MED ORDER — ONDANSETRON HCL 4 MG/2ML IJ SOLN: 4.0000 mg | Freq: Four times a day (QID) | INTRAMUSCULAR | Status: DC | PRN

## 2023-06-22 MED ORDER — ACETAMINOPHEN 325 MG PO TABS: 650.0000 mg | ORAL_TABLET | Freq: Four times a day (QID) | ORAL | Status: DC | PRN

## 2023-06-22 MED ORDER — ACETAMINOPHEN 650 MG RE SUPP: 650.0000 mg | Freq: Four times a day (QID) | RECTAL | Status: DC | PRN

## 2023-06-22 MED ORDER — HEPARIN SODIUM (PORCINE) 5000 UNIT/ML IJ SOLN
5000.0000 [IU] | Freq: Three times a day (TID) | INTRAMUSCULAR | Status: DC
  Administered 2023-06-22 – 2023-06-23 (×3): 5000 [IU] via SUBCUTANEOUS
  Filled 2023-06-22 (×2): qty 1

## 2023-06-22 MED ORDER — IOHEXOL 350 MG/ML SOLN
75.0000 mL | Freq: Once | INTRAVENOUS | Status: AC | PRN
  Administered 2023-06-22: 60 mL via INTRAVENOUS

## 2023-06-22 MED ORDER — LORAZEPAM 2 MG/ML IJ SOLN
0.5000 mg | INTRAMUSCULAR | Status: DC | PRN
  Administered 2023-06-22: 0.5 mg via INTRAVENOUS
  Filled 2023-06-22: qty 1

## 2023-06-22 MED ORDER — ONDANSETRON HCL 4 MG PO TABS: 4.0000 mg | ORAL_TABLET | Freq: Four times a day (QID) | ORAL | Status: DC | PRN

## 2023-06-22 NOTE — Telephone Encounter (Signed)
I spoke with pt and she is on her way to [facility] ED now. Sending note to Hayden Pedro FNP.

## 2023-06-22 NOTE — Telephone Encounter (Signed)
FYI: This call has been transferred to Access Nurse. Once the result note has been entered staff can address the message at that time.  Patient called in with the following symptoms:  Red Word:dizziness ,blacked out,sweat running off of her,episode lasted for 10 minutes   Please advise at Mobile [phone] (mobile)  Message is routed to Provider Pool and [facility] Triage

## 2023-06-22 NOTE — Telephone Encounter (Signed)
Noted I agree with ER recommendation. Do you know if she was driving herself and or being transported by another person? Worry for syncope while driving.  Irregardless from note looks like already on way so will f/u upon ER discharge.

## 2023-06-22 NOTE — ED Provider Notes (Signed)
[facility] Provider Note    Event Date/Time   First MD Initiated Contact with Patient 06/22/23 1404     (approximate)   History   No chief complaint on file.   HPI  Dawn Matthews is a 67 y.o. female with history of hypertension who comes in with concerns for syncopal episode.  Patient reports that she did not fully blackout that she remembers everything that happens but she had sudden onset of diaphoresis feeling her body get all weak and lowered herself down to the ground.  Did not hit her head.  States that she did not have any seizure-like activity.  Denies any chest pain at this time.  Maybe some mild shortness of breath and she does report recent travel to [place] but no other risk factors for PE  Physical Exam   Triage Vital Signs: ED Triage Vitals  Encounter Vitals Group     BP 06/22/23 1151 (!) 139/96     Systolic BP Percentile --      Diastolic BP Percentile --      Pulse Rate 06/22/23 1151 73     Resp 06/22/23 1151 18     Temp 06/22/23 1149 97.9 F (36.6 C)     Temp src --      SpO2 06/22/23 1151 100 %     Weight 06/22/23 1151 145 lb (65.8 kg)     Height 06/22/23 1151 5\' 4"  (1.626 m)     Head Circumference --      Peak Flow --      Pain Score 06/22/23 1151 0     Pain Loc --      Pain Education --      Exclude from Growth Chart --     Most recent vital signs: Vitals:   06/22/23 1149 06/22/23 1151  BP:  (!) 139/96  Pulse:  73  Resp:  18  Temp: 97.9 F (36.6 C)   SpO2:  100%     General: Awake, no distress.  CV:  Good peripheral perfusion.  Resp:  Normal effort.  Clear lungs Abd:  No distention.  Other:  No swelling in legs   ED Results / Procedures / Treatments   Labs (all labs ordered are listed, but only abnormal results are displayed) Labs Reviewed  BASIC METABOLIC PANEL - Abnormal; Notable for the following components:      Result Value   Sodium 134 (*)    Glucose, Bld 137 (*)    All other components  within normal limits  CBC  D-DIMER, QUANTITATIVE  BRAIN NATRIURETIC PEPTIDE  TROPONIN I (HIGH SENSITIVITY)  TROPONIN I (HIGH SENSITIVITY)     EKG  My interpretation of EKG:  Normal sinus rate of 70 without any ST elevation or T wave inversions, normal intervals  RADIOLOGY I have reviewed the xray personally and interpreted no pneumonia PROCEDURES:  Critical Care performed: No  .1-3 Lead EKG Interpretation  Performed by: Concha Se, MD Authorized by: Concha Se, MD     Interpretation: normal     ECG rate:  70   ECG rate assessment: normal     Rhythm: sinus rhythm     Ectopy: none     Conduction: normal      MEDICATIONS ORDERED IN ED: Medications  sodium chloride flush (NS) 0.9 % injection 3 mL (has no administration in time range)  heparin injection 5,000 Units (has no administration in time range)  senna-docusate (Senokot-S) tablet 1 tablet (  has no administration in time range)  acetaminophen (TYLENOL) tablet 650 mg (has no administration in time range)    Or  acetaminophen (TYLENOL) suppository 650 mg (has no administration in time range)  ondansetron (ZOFRAN) tablet 4 mg (has no administration in time range)    Or  ondansetron (ZOFRAN) injection 4 mg (has no administration in time range)     IMPRESSION / MDM / ASSESSMENT AND PLAN / ED COURSE  I reviewed the triage vital signs and the nursing notes.   Patient's presentation is most consistent with acute presentation with potential threat to life or bodily function.   Patient comes in with syncopal/near syncopal episode denies hitting her head.  Blood work ordered to evaluate for ACS.  Will keep on the cardiac monitor.  Low risk for PE given recent travel we will get a D-dimer.  This is still pending at time of disposition.  Given patient's age and how symptomatic she was I recommended admission for cardiac monitoring, echocardiogram.  Her blood work was otherwise reassuring with negative troponin  reassuring CBC and BMP.  Discussed case with Dr. Sedalia Muta for admission  The patient is on the cardiac monitor to evaluate for evidence of arrhythmia and/or significant heart rate changes.      FINAL CLINICAL IMPRESSION(S) / ED DIAGNOSES   Final diagnoses:  Near syncope     Rx / DC Orders   ED Discharge Orders     None        Note:  This document was prepared using Dragon voice recognition software and may include unintentional dictation errors.   Concha Se, MD 06/22/23 [phone]

## 2023-06-22 NOTE — Assessment & Plan Note (Signed)
With hyperglycemia though this is a nonfasting value Continue to monitor outpatient as appropriate

## 2023-06-22 NOTE — Telephone Encounter (Signed)
I spoke with pt and she did drive herself to ED; I explained concern about pt driving vehicle due to possible syncope and pt voiced understanding but pt said she got to ED safely. Sending FYI to Hayden Pedro FNP.

## 2023-06-22 NOTE — Assessment & Plan Note (Addendum)
With intermittent blurry vision, double vision, and lower extremity weakness with difficulty walking Patient also had recent car trip to [place] and has been having worsening shortness of breath since then, therefore a PE cannot be excluded Acute stroke and/or multiple sclerosis cannot be excluded at this time Continue to follow high since troponin, complete echo ordered, D-dimer, check procalcitonin CT head wo contrast was read as negative for acute intracranial abnormality. MRI of the brain without contrast has been ordered CTA to assess for PE has been ordered Neurology has been consulted via staff message time to 06/23/23 0700 hrs. Fall precaution Complete echo ordered Admit to telemetry cardiac, observation

## 2023-06-22 NOTE — ED Notes (Signed)
Pt to CT

## 2023-06-22 NOTE — ED Notes (Signed)
Patient ambulated to the bathroom with minimal assistance.  No complaints of dizziness.  Denies sob, pain.

## 2023-06-22 NOTE — Assessment & Plan Note (Signed)
Fall precautions.  

## 2023-06-22 NOTE — H&P (Addendum)
History and Physical   Dawn Matthews VHQ:[id number] DOB: Jun 05, 1956 DOA: 06/22/2023  PCP: Mort Sawyers, FNP  Patient coming from: Home via POV  I have personally briefly reviewed patient's old medical records in [facility] EMR.  Chief Concern: Syncope  HPI: Ms. 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  Vitals in the ED showed temperature of 97.9, respiration rate 18, heart rate 73, blood pressure 139/96, SpO2 100% on room air.  Serum sodium is 134, potassium 2.7, chloride 100, bicarb 26, BUN of 19, serum creatinine of 0.76, EGFR greater than 60, nonfasting blood glucose 137, WBC 7.2, hemoglobin 13.5, platelets of 268.  ED treatment: None ----------------------------- At bedside, patient able to tell me her name, age, location, current calendar year.  She woke up at around 4:30a (normal for her), she made a pot of coffee and portioning her chicken soup in the kitchen. Then she experienced diaphoresis, profound weakness of her whole body, acute onset of nausea. She went to sit on the couch and it did not go away.  She endorses associated blurriness.  She denies vomiting.  Then she felt she needed to use the restroom, had two bowel movements and she still did not feel better.  She sat herself down on the restroom floor after having a bowel movement and tried to make a phone call. She had her phone with her, and she tried to call 911. She had difficulty seeing her phone to dial 911.   She reports this lasted about 15-20 minutes.   Then the symptoms resolved and she had her coffee, and went back to bed.   The first time something similar happened was 5 years ago. She also reports something that happened about 5 months ago. Today, the episode was the most severe.   She reports occasionally having double vision over the  past 5 years although she cannot pinpoint when the last time she had a double vision was.  She also reports 1 episode a few months back where she developed bilateral lower extremity weakness with sensations like electricity going down her legs.  She reports this lasted for several hours and then resolved.  She denies known trauma to her person.  She reports that a few weeks ago, she did make a drive to [place] and that she frequently drives to [place].  She reports that since then she has been having worsening weakness and shortness of breath.  She denies swelling of her lower extremities.  Social history: She lives at home.  She denies tobacco, EtOH, recreational drug use.  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  ROS: Constitutional: no weight change, no fever ENT/Mouth: no sore throat, no rhinorrhea Eyes: no eye pain, no vision changes Cardiovascular: no chest pain, + dyspnea,  no edema, no palpitations Respiratory: no cough, no sputum, no wheezing Gastrointestinal: + nausea, no vomiting, no diarrhea, no constipation Genitourinary: no urinary incontinence, no dysuria, no hematuria Musculoskeletal: no arthralgias, no myalgias Skin: no skin lesions, no pruritus, Neuro: + weakness, no loss of consciousness, no syncope Psych: no anxiety, no depression, + decrease appetite Heme/Lymph: no bruising, no bleeding  ED Course: Discussed with EDP, patient requiring hospitalization for chief concerns of syncope.  Assessment/Plan  Principal Problem:   Syncope Active Problems:   Prediabetes   Osteopenia   Chronic  fatigue   Memory difficulties   Dyspnea on exertion   Anxiety   Assessment and Plan:  * Syncope With intermittent blurry vision, double vision, and lower extremity weakness with difficulty walking Patient also had recent car trip to [place] and has been having worsening shortness of breath since then, therefore a PE cannot be  excluded Acute stroke and/or multiple sclerosis cannot be excluded at this time Continue to follow high since troponin, complete echo ordered, D-dimer, check procalcitonin CT head wo contrast was read as negative for acute intracranial abnormality. MRI of the brain without contrast has been ordered CTA to assess for PE has been ordered Neurology has been consulted via staff message time to 06/23/23 0700 hrs. Fall precaution Complete echo ordered Admit to telemetry cardiac, observation  Anxiety Especially with MRI Ativan 0.5 mg IV as needed for anxiety, please give before MRI, 2 doses ordered  Dyspnea on exertion Given recent car trip to [place], and mildly elevated D-dimer, pulmonary embolism cannot be excluded at this time CTA PE has been ordered on admission  Chronic fatigue Fall precautions  Prediabetes With hyperglycemia though this is a nonfasting value Continue to monitor outpatient as appropriate  Chart reviewed.   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 She has never used smokeless tobacco. She  reports that she does not currently use alcohol. She reports that she does not use drugs.  Allergies  Allergen Reactions   Penicillins Rash   Codeine    Family History  Problem Relation Age of Onset   Diabetes Mother    COPD Mother    Hypertension Mother    Atrial fibrillation Mother    Diabetes Father    Melanoma Sister    Hypertension Sister    Lung cancer Sister        smoker   Melanoma Brother    Diabetes Maternal Grandmother    Breast cancer Neg Hx    Colon polyps Neg Hx    Colon cancer Neg Hx    Esophageal cancer Neg Hx    Rectal cancer Neg Hx    Stomach cancer Neg Hx    Family history: Family history reviewed and not pertinent.  Prior to Admission medications   Medication Sig Start Date End Date Taking? Authorizing Provider  albuterol (VENTOLIN HFA) 108 (90 Base) MCG/ACT inhaler Inhale 2 puffs into the lungs every 4 (four) hours as needed for shortness of breath. 04/16/23   Mort Sawyers, FNP  furosemide (LASIX) 20 MG tablet Take 1 tablet (  20 mg total) by mouth daily. With extra as needed after lunch 10/04/22   Antonieta Iba, MD  Multiple Vitamins-Minerals (CENTRUM SILVER PO) Take 1 tablet by mouth daily at 6 (six) AM.    [provider]  omeprazole (PRILOSEC) 20 MG capsule Take 1 capsule (20 mg total) by mouth daily for 14 days. 04/12/23 04/26/23  Mort Sawyers, FNP  potassium chloride (KLOR-CON) 10 MEQ tablet Take 1 tablet (10 mEq total) by mouth daily. With extra as needed 10/04/22   Antonieta Iba, MD  traZODone (DESYREL) 50 MG tablet Take 0.5-1 tablets (25-50 mg total) by mouth at bedtime as needed for sleep. 04/16/23   Mort Sawyers, FNP  VITAMIN D, CHOLECALCIFEROL, PO Take 1 tablet by mouth daily at 6 (six) AM.    [provider]   Physical Exam: Vitals:   06/22/23 1149 06/22/23 1151  BP:  (!) 139/96  Pulse:  73  Resp:  18  Temp: 97.9 F (36.6 C)   SpO2:  100%  Weight:  65.8 kg  Height:  5\' 4"  (1.626 m)   Constitutional: appears  age-appropriate, NAD, calm Eyes: PERRL, lids and conjunctivae normal ENMT: Mucous membranes are moist. Posterior pharynx clear of any exudate or lesions. Age-appropriate dentition. Hearing appropriate Neck: normal, supple, no masses, no thyromegaly Respiratory: clear to auscultation bilaterally, no wheezing, no crackles. Normal respiratory effort. No accessory muscle use.  Cardiovascular: Regular rate and rhythm, no murmurs / rubs / gallops. No extremity edema. 2+ pedal pulses. No carotid bruits.  Abdomen: no tenderness, no masses palpated, no hepatosplenomegaly. Bowel sounds positive.  Musculoskeletal: no clubbing / cyanosis. No joint deformity upper and lower extremities. Good ROM, no contractures, no atrophy. Normal muscle tone.  Skin: no rashes, lesions, ulcers. No induration Neurologic: Sensation intact. Strength 5/5 in all 4.  Psychiatric: Normal judgment and insight. Alert and oriented x 3. Normal mood.   EKG: independently reviewed, showing sinus rhythm with rate of 70, QTc 423  Chest x-ray on Admission: I personally reviewed and I agree with radiologist reading as below.  DG Chest 2 View  Result Date: 06/22/2023 CLINICAL DATA:  Syncope, diaphoresis and nausea. EXAM: CHEST - 2 VIEW COMPARISON:  06/28/2021 FINDINGS: The heart size and mediastinal contours are within normal limits. Stable scarring at both lung apices. There is no evidence of pulmonary edema, consolidation, pneumothorax, nodule or pleural fluid. The visualized skeletal structures are unremarkable. IMPRESSION: No active cardiopulmonary disease. Stable scarring at both lung apices. Electronically Signed   By: Irish Lack M.D.   On: 06/22/2023 16:07   CT HEAD WO CONTRAST ( )  Result Date: 06/22/2023 CLINICAL DATA:  Syncope/presyncope, cerebrovascular cause suspected EXAM: CT HEAD WITHOUT CONTRAST TECHNIQUE: Contiguous axial images were obtained from the base of the skull through the vertex without intravenous  contrast. RADIATION DOSE REDUCTION: This exam was performed according to the departmental dose-optimization program which includes automated exposure control, adjustment of the mA and/or kV according to patient size and/or use of iterative reconstruction technique. COMPARISON:  None Available. FINDINGS: Brain: No evidence of acute infarction, hemorrhage, hydrocephalus, extra-axial collection or mass lesion/mass effect. Vascular: No hyperdense vessel Skull: Normal. Negative for fracture or focal lesion. Sinuses/Orbits: No acute finding. IMPRESSION: No evidence of acute intracranial abnormality. Electronically Signed   By: Feliberto Harts M.D.   On: 06/22/2023 15:35    Labs on Admission: I have personally reviewed following labs CBC: Recent Labs  Lab 06/22/23 1153  WBC 7.2  HGB 13.5  HCT 40.2  MCV 88.9  PLT 268   Basic Metabolic Panel: Recent Labs  Lab 06/22/23 1153  NA 134*  K 3.7  CL 100  CO2 26  GLUCOSE 137*  BUN 19  CREATININE 0.76  CALCIUM 9.2   GFR: Estimated Creatinine Clearance: 63.7 mL/min (by C-G formula based on SCr of 0.76 mg/dL).  This document was prepared using Dragon Voice Recognition software and may include unintentional dictation errors.  Dr. Sedalia Muta [facility]  If 7PM-7AM, please contact overnight-coverage provider If 7AM-7PM, please contact day attending provider [email or web address]  06/22/2023, 4:41 PM

## 2023-06-22 NOTE — ED Triage Notes (Signed)
Pt to ED for near syncope this am with diaphoresis and nausea.

## 2023-06-22 NOTE — Assessment & Plan Note (Signed)
Given recent car trip to [place], and mildly elevated D-dimer, pulmonary embolism cannot be excluded at this time CTA PE has been ordered on admission

## 2023-06-22 NOTE — Assessment & Plan Note (Signed)
Especially with MRI Ativan 0.5 mg IV as needed for anxiety, please give before MRI, 2 doses ordered

## 2023-06-22 NOTE — Hospital Course (Addendum)
Ms. 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  Vitals in the ED showed temperature of 97.9, respiration rate 18, heart rate 73, blood pressure 139/96, SpO2 100% on room air.  Serum sodium is 134, potassium 2.7, chloride 100, bicarb 26, BUN of 19, serum creatinine of 0.76, EGFR greater than 60, nonfasting blood glucose 137, WBC 7.2, hemoglobin 13.5, platelets of 268.  ED treatment: None

## 2023-06-23 ENCOUNTER — Observation Stay: Payer: Medicare HMO

## 2023-06-23 DIAGNOSIS — R42 Dizziness and giddiness: Secondary | ICD-10-CM

## 2023-06-23 DIAGNOSIS — I700 Atherosclerosis of aorta: Secondary | ICD-10-CM | POA: Diagnosis not present

## 2023-06-23 DIAGNOSIS — R531 Weakness: Secondary | ICD-10-CM | POA: Diagnosis not present

## 2023-06-23 DIAGNOSIS — R55 Syncope and collapse: Secondary | ICD-10-CM | POA: Diagnosis not present

## 2023-06-23 DIAGNOSIS — R413 Other amnesia: Secondary | ICD-10-CM | POA: Diagnosis not present

## 2023-06-23 DIAGNOSIS — I6529 Occlusion and stenosis of unspecified carotid artery: Secondary | ICD-10-CM | POA: Diagnosis not present

## 2023-06-23 LAB — URINE DRUG SCREEN, QUALITATIVE (ARMC ONLY)
Amphetamines, Ur Screen: NOT DETECTED
Barbiturates, Ur Screen: NOT DETECTED
Benzodiazepine, Ur Scrn: NOT DETECTED
Cannabinoid 50 Ng, Ur ~~LOC~~: NOT DETECTED
Cocaine Metabolite,Ur ~~LOC~~: NOT DETECTED
MDMA (Ecstasy)Ur Screen: NOT DETECTED
Methadone Scn, Ur: NOT DETECTED
Opiate, Ur Screen: NOT DETECTED
Phencyclidine (PCP) Ur S: NOT DETECTED
Tricyclic, Ur Screen: NOT DETECTED

## 2023-06-23 LAB — CBC
HCT: 38 % (ref 36.0–46.0)
Hemoglobin: 13.3 g/dL (ref 12.0–15.0)
MCH: 30.2 pg (ref 26.0–34.0)
MCHC: 35 g/dL (ref 30.0–36.0)
MCV: 86.2 fL (ref 80.0–100.0)
Platelets: 274 10*3/uL (ref 150–400)
RBC: 4.41 MIL/uL (ref 3.87–5.11)
RDW: 12.3 % (ref 11.5–15.5)
WBC: 5.5 10*3/uL (ref 4.0–10.5)
nRBC: 0 % (ref 0.0–0.2)

## 2023-06-23 LAB — URINALYSIS, COMPLETE (UACMP) WITH MICROSCOPIC
Bacteria, UA: NONE SEEN
Bilirubin Urine: NEGATIVE
Glucose, UA: NEGATIVE mg/dL
Hgb urine dipstick: NEGATIVE
Ketones, ur: NEGATIVE mg/dL
Leukocytes,Ua: NEGATIVE
Nitrite: NEGATIVE
Protein, ur: NEGATIVE mg/dL
RBC / HPF: 0 RBC/hpf (ref 0–5)
Specific Gravity, Urine: 1.04 — ABNORMAL HIGH (ref 1.005–1.030)
Squamous Epithelial / HPF: 0 /[HPF] (ref 0–5)
WBC, UA: 0 WBC/hpf (ref 0–5)
pH: 7 (ref 5.0–8.0)

## 2023-06-23 LAB — BASIC METABOLIC PANEL
Anion gap: 9 (ref 5–15)
BUN: 13 mg/dL (ref 8–23)
CO2: 25 mmol/L (ref 22–32)
Calcium: 9 mg/dL (ref 8.9–10.3)
Chloride: 104 mmol/L (ref 98–111)
Creatinine, Ser: 0.72 mg/dL (ref 0.44–1.00)
GFR, Estimated: >60 mL/min (ref >60)
Glucose, Bld: 90 mg/dL (ref 70–99)
Potassium: 3.9 mmol/L (ref 3.5–5.1)
Sodium: 138 mmol/L (ref 135–145)

## 2023-06-23 LAB — MAGNESIUM: Magnesium: 2.3 mg/dL (ref 1.7–2.4)

## 2023-06-23 LAB — PHOSPHORUS: Phosphorus: 3.6 mg/dL (ref 2.5–4.6)

## 2023-06-23 MED ORDER — IOHEXOL 350 MG/ML SOLN
75.0000 mL | Freq: Once | INTRAVENOUS | Status: AC | PRN
  Administered 2023-06-23: 75 mL via INTRAVENOUS

## 2023-06-23 NOTE — Consult Note (Signed)
NEUROLOGY CONSULT NOTE   Date of service: June 23, 2023 Patient Name: Dawn Matthews MRN:  [id number] DOB:  06-26-1956 Chief Complaint: "Dizziness, nausea, almost passing out" Requesting Provider: Sunnie Nielsen, DO  History of Present Illness  Dawn Matthews is a 67 y.o. 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    She reports she was in her usual state of health when she woke at 4 AM which is actually her normal sleep pattern having slept 6 hours.  She was making coffee for herself when she suddenly began to feel dizzy.  She fell like she needed to have a bowel movement and had 2 bowel movements.  Felt extremely nauseated but did not vomit.  When she tried to call 911 she noticed her vision was very blurry and it was hard to see the phone and dial the numbers.  The whole episode lasted about 10 to 15 minutes and then resolved.  She did retain awareness, she was not incontinent.  She has had similar episodes before but never this severe.  She reports about 1 year ago having a severe electrical shock sensation in both of her legs lasting 1 to 2 minutes (again similar to prior episodes but this was the most severe episode; no significant recurrence since)  She does not typically get headaches although today she did have some mild headaches during her recent trip to [place].  These have all resolved and she had no recent headaches here  No recent falls (had a mechanical fall approximately 1 year ago), she did have some dental work recently but no recent neck trauma or neck pain   01/09/2022 neuro eval How long did patient have memory difficulties? For several years, between 5-7 years, especially with simple words."I am catholic, when I take my walks I say the [facility], but 6 months ago couldn't remember it, so I got worried . I like to watch movies and if someone asks about the  protagonist, I cannot come up immediately with the name except " is the person that begins with a B".  She likes to do crossword puzzles and reading, and watch game shows as Jeopardy.  Patient drives?   Getting worse at directions . Patient uses GPS to drive   Any focal numbness or tingling?  About 1 month ago, while walking, the patient felt "as if the leg was missing, can't describe the feeling ", felt like her leg was going to give out or not work.  Happened twice in May, did not notice a foot drop, did not feel lifting her knee, she denies any pain.  This lasted for a minute at each episode, it was very "strange feeling ", no swelling, does not know if she felt numbness.  Denies any back pain.  Did not feel any electrical shock with that  event. However, she had one episode while lying in bed, 6-7 month ago when she felt  both  arms and legs  having an electrical shock without recurrence. Paranoia?  Patient denies "I am worry wort"  "Nervous Nelly" -as I am getting older"  Patient reports that he sleeps poorly," I have a system go to sleep 1-2-3 , then get get up a pot of coffee at 2 am, turn the TV on and fall asleep again . Denies vivid dreams, REM behavior or sleepwalking .    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     Past Surgical History:  Procedure Laterality Date   COLONOSCOPY  09/19/2021   [facility]   DILATION AND CURETTAGE OF UTERUS     FOOT SURGERY  2010   POLYPECTOMY     SKIN CANCER EXCISION     basal cell   WISDOM TOOTH EXTRACTION      Family History: Family History  Problem Relation Age of  Onset   Diabetes Mother    COPD Mother    Hypertension Mother    Atrial fibrillation Mother    Diabetes Father    Melanoma Sister    Hypertension Sister    Lung cancer Sister        smoker   Melanoma Brother    Diabetes Maternal Grandmother    Breast cancer Neg Hx    Colon polyps Neg Hx    Colon cancer Neg Hx    Esophageal cancer Neg Hx    Rectal cancer Neg Hx    Stomach cancer Neg Hx     Social History  reports that she has never smoked. She has never used smokeless tobacco. She reports that she does not currently use alcohol. She reports that she does not use drugs.  Allergies  Allergen Reactions   Penicillins Rash   Codeine     Medications   Current Facility-Administered Medications:    acetaminophen (TYLENOL) tablet 650 mg, 650 mg, Oral, Q6H PRN **OR** acetaminophen (TYLENOL) suppository 650 mg, 650 mg, Rectal, Q6H PRN, Cox, Amy N, DO   heparin injection 5,000 Units, 5,000 Units, Subcutaneous, Q8H, Cox, Amy N, DO, 5,000 Units at 06/23/23 0559   LORazepam (ATIVAN) injection 0.5 mg, 0.5 mg, Intravenous, PRN, Cox, Amy N, DO, 0.5 mg at 06/22/23 1826   ondansetron (ZOFRAN) tablet 4 mg, 4 mg, Oral, Q6H PRN **OR** ondansetron (ZOFRAN) injection 4 mg, 4 mg, Intravenous, Q6H PRN, Cox, Amy N, DO   senna-docusate (Senokot-S) tablet 1 tablet, 1 tablet, Oral, QHS PRN, Cox, Amy N, DO   sodium chloride flush (NS) 0.9 % injection 3 mL, 3 mL, Intravenous, Q12H, Cox, Amy N, DO, 3 mL at 06/23/23 0959  Current Outpatient Medications:    albuterol (VENTOLIN HFA) 108 (90 Base) MCG/ACT inhaler, Inhale 2 puffs into the lungs every 4 (four) hours as needed for shortness of breath., Disp: 1 each, Rfl: 5   furosemide (LASIX) 20 MG tablet, Take 1 tablet (20 mg total) by mouth daily. With extra as needed after lunch, Disp: 145 tablet, Rfl: 3   Multiple Vitamins-Minerals (CENTRUM SILVER PO), Take 1 tablet by mouth daily at 6 (six) AM., Disp: , Rfl:    potassium chloride (KLOR-CON) 10 MEQ tablet,  Take 1 tablet (10 mEq total) by mouth daily. With extra as needed, Disp: 145 tablet, Rfl: 3   traZODone (DESYREL) 50 MG tablet, Take 0.5-1 tablets (25-50 mg total) by mouth at bedtime as needed for sleep., Disp: 90 tablet, Rfl: 0   VITAMIN D, CHOLECALCIFEROL, PO, Take 1 tablet by mouth daily at 6 (six) AM., Disp: , Rfl:    omeprazole (  PRILOSEC) 20 MG capsule, Take 1 capsule (20 mg total) by mouth daily for 14 days., Disp: 14 capsule, Rfl: 0  Vitals   Vitals:   06/23/23 0800 06/23/23 0830 06/23/23 0900 06/23/23 0930  BP: 90/75 111/69 106/71 99/61  Pulse: 64 65 (!) 58 (!) 57  Resp: 19 17 17 16   Temp:      TempSrc:      SpO2: 95% 98% 96% 96%  Weight:      Height:        Body mass index is 24.89 kg/m.  Physical Exam   Constitutional: Appears thin but well-developed Psych: Affect calm and cooperative Eyes: No scleral injection.  HENT: No OP obstruction.  Head: Normocephalic.  Cardiovascular: Normal rate and regular rhythm.  Respiratory: Effort normal, non-labored breathing.  GI: Soft.  No distension. There is no tenderness.  Skin: WDI.   Neurologic Examination   Neuro: Mental Status: Patient is awake, alert, oriented to person, place, month, year, and situation. Patient is able to give a clear and coherent history. No signs of aphasia or neglect Cranial Nerves: II: Visual Fields are full. Pupils are equal, round, and reactive to light.   III,IV, VI: EOMI without ptosis or diploplia.  Smooth pursuits possibly slightly restricted upgaze V: Facial sensation is symmetric to temperature VII: Facial movement is symmetric.  VIII: hearing is intact to voice X: Uvula elevates symmetrically XI: Shoulder shrug is symmetric. XII: tongue is midline without atrophy or fasciculations.  Motor: Tone is normal. Bulk is normal. 5/5 strength was present in all four extremities.  Sensory: Sensation is symmetric to light touch and temperature in the arms and legs. Deep Tendon Reflexes: 2+  and symmetric in the biceps and patellae.  Cerebellar: FNF and HKS are intact bilaterally Gait: Steady on standing at the side of the bed, able to rise on heels and toes.  Able to maintain tandem stance with minimal unsteadiness, very mildly positive Romberg  Montreal cognitive assessment was 24, specifically -1 for incorrectly positioning clock hands (2:55 instead of 11:10) -1 for serial sevens -1 for abstraction -2 for delayed recall    Labs/Imaging/Neurodiagnostic studies   CBC:  Recent Labs  Lab Jul 06, 2023 1153 06/23/23 0602  WBC 7.2 5.5  HGB 13.5 13.3  HCT 40.2 38.0  MCV 88.9 86.2  PLT 268 274   Basic Metabolic Panel:  Lab Results  Component Value Date   NA 138 06/23/2023   K 3.9 06/23/2023   CO2 25 06/23/2023   GLUCOSE 90 06/23/2023   BUN 13 06/23/2023   CREATININE 0.72 06/23/2023   CALCIUM 9.0 06/23/2023   GFRNONAA >60 06/23/2023   Lipid Panel:  Lab Results  Component Value Date   LDLCALC 85 04/12/2023   HgbA1c:  Lab Results  Component Value Date   HGBA1C 6.0 04/12/2023   Urine Drug Screen: No results found for: "LABOPIA", "COCAINSCRNUR", "LABBENZ", "AMPHETMU", "THCU", "LABBARB"  Alcohol Level No results found for: "ETH"  MRI Brain(Personally reviewed): No acute intracranial process. No evidence of acute or subacute infarct.  D-Dimer 0.66 but CTA chest negative   ASSESSMENT   No clear etiology of this patient's his severe spell of dizziness.  Appreciate medicine team's cardiac workup.  From a neurological perspective I would like to rule out vertebrobasilar insufficiency as well as checking urine studies.  Her memory seems perhaps slightly worse based on MoCA assessment although assessment in the acute care setting tends to overestimate deficits.  However she reports she felt like she had slept quite  well in the hospital and did not feel overly tired/distracted.  This can be followed up outpatient  Otherwise her examination is very reassuring and  stable from prior  RECOMMENDATIONS  - UA, UDS - CTA head and neck - If this study is reassuring, continue outpatient follow-up with neurology, consider formal neurocognitive testing on an outpatient basis - Patient reports she is very eager to go home after CTA and echocardiogram are completed, appreciate medical clearance per primary team - Thank you for involving [place] in the care of this patient, recommendations conveyed to primary team via secure chat ______________________________________________________________________    Signed, Gordy Councilman, MD [facility] Available 7 AM to 7 PM, outside these hours please contact Neurologist on call listed on AMION

## 2023-06-23 NOTE — Discharge Summary (Signed)
Physician Discharge Summary   Patient: Dawn Matthews MRN: [id number]  DOB: [birth year]   Admit:     Date of Admission: 06/22/2023 Admitted from: home   Discharge: Date of discharge: 06/23/23 Disposition: Home Condition at discharge: good  CODE STATUS: FULL CODE      Discharge Physician: Sunnie Nielsen, DO [facility]     PCP: Mort Sawyers, FNP  Recommendations for Outpatient Follow-up:  Follow up with PCP Mort Sawyers, FNP ASAP  Follow up with cardiology ASAP  Consider Zio monitor and echocardiogram     Discharge Instructions     Diet - low sodium heart healthy   Complete by: As directed    Discharge instructions   Complete by: As directed    DECISION MADE FOR DISCHARGE PRIOR TO COMPLETION OF ECHOCARDIOGRAM - LOW SUSPICION FOR STRUCTURAL HEART PROBLEMS BUT IDEALLY WOULD WANT TO RULE THIS OUT. FELT OK FOR DISCHARGE HOME TO FOLLOW OUTPATIENT, IF SYMPTOMS WORSE/RECUR PLEASE SEEK EMERGENCY MEDICAL CARE. 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 Reports she woke up at around 4:30a (normal for her), she made a pot of coffee and portioning her chicken soup in the kitchen. Then she experienced diaphoresis, weakness of her whole body, acute  nausea. She went to sit on the couch and it did not go away.   Then she felt she needed to use the restroom, had two bowel movements and she still did not feel better.  She sat herself down on the  restroom floor after having a bowel movement and tried to make a phone call. She had difficulty seeing her phone to dial 911. She reports this lasted about 15-20 minutes, then the symptoms resolved and she had her coffee, and went back to bed. The first time something similar happened was 5 years ago. She also reports something that happened about 5 months ago. Today, the episode was the most severe. She reports that a few weeks ago, she did make a drive to [place] and that she frequently drives to [place].  She reports that since then she has been having worsening weakness and shortness of breath.  She denies swelling of her lower extremities.   Hospital course / significant events:  11/15: to ED   Consultants:  neurology  Procedures/Surgeries: none      ASSESSMENT & PLAN:   Syncope  Ruled out PE, ruled out LE DVT w/ LE edema / symptoms  --> CTA Chest: no PE  Question cerebrovascular disease - ruled out CVA, hemorrhage, mass  --> CT head: no concerns --> MRI brain: No acute intracranial process. No evidence of acute or subacute infarct. --> CTA H/N no concerns   Question other neurological disorder  --> neurology consult, no major concerns   Ruled out MI --> troponins WNL / flat   Question arrhythmia  --> telemetry  --> consider Zio monitor which can be arranged outpatient  Question structural cardiac disease --> Echo was not able to be done today - discussion w/ patient on waiting until tomorrow but I have low suspicion for functional or structural heart abnormality other than possible intermittent arrhythmia so would prioritize outpatient Zio monitor but ideally would like echo for completeness - in shared decision making pt opts for discharge today and will follow w/ her outpatient team asap, ED return precaution reviewed  Reasonably r/o infection  --> no symptoms --> neg procalcitonin     Anxiety Especially with MRI Follow outpatient    Dyspnea on  exertion Follow outpatient    Prediabetes With hyperglycemia though this is a nonfasting value Continue to monitor outpatient as appropriate              Discharge Instructions  Allergies as of 06/23/2023       Reactions   Penicillins Rash   Codeine         Medication List     STOP taking these medications    omeprazole 20 MG capsule Commonly known as: PRILOSEC   traZODone 50 MG tablet Commonly known as: DESYREL       TAKE these medications    albuterol 108 (90 Base) MCG/ACT inhaler Commonly known as: VENTOLIN HFA Inhale 2 puffs into the lungs every 4 (four) hours as needed for shortness of breath.   CENTRUM SILVER PO Take 1 tablet by mouth daily at 6 (six) AM.   furosemide 20 MG tablet Commonly known as: LASIX Take 1 tablet (20 mg total) by mouth daily. With extra as needed after lunch   potassium chloride 10 MEQ tablet Commonly known as: KLOR-CON Take 1 tablet (10 mEq total) by mouth daily. With extra as needed   VITAMIN D (CHOLECALCIFEROL) PO Take 1 tablet by mouth daily at 6 (six) AM.         Follow-up Information     Mort Sawyers, FNP. Call.   Specialty: Family Medicine Why: HOSPITAL FOLLOW UP APPT Contact information: [address] [place] [address] [phone]         Antonieta Iba, MD. Call.   Specialty: Cardiology Why: HOSPITAL FOLLOW UP APPT, CONSIDER HEART MONITOR AND ECHOCARDIOGRAM Contact information: [address] [place] [address] [phone]                 Allergies  Allergen Reactions   Penicillins Rash   Codeine      Subjective: pt reports she feels fine, confirms above history, no concerns now, ambulating without difficulty, no dizziness, no HA/VC, no presyncope, no weakness, she is eager for discharge home    Discharge Exam: BP 113/69   Pulse 62   Temp 97.8 F (36.6 C) (Oral)   Resp 16   Ht 5\' 4"  (1.626 m)   Wt 65.8 kg   SpO2 96%   BMI 24.89  kg/m  General: Pt is alert, awake, not in acute distress Cardiovascular: RRR, S1/S2, no rubs, no gallops Respiratory: CTA bilaterally, no wheezing, no rhonchi Abdominal: Soft, NT, ND, Extremities: no edema, no cyanosis     The results of significant diagnostics from this hospitalization (including imaging, microbiology, ancillary and laboratory) are listed below for reference.     Microbiology: No results found for this or any previous visit (from the past 240 hour(s)).   Labs: BNP (last 3 results) Recent Labs    06/22/23 1503  BNP 53.0   Basic Metabolic Panel: Recent Labs  Lab 06/22/23 1153  06/23/23 0602  NA 134* 138  K 3.7 3.9  CL 100 104  CO2 26 25  GLUCOSE 137* 90  BUN 19 13  CREATININE 0.76 0.72  CALCIUM 9.2 9.0  MG  --  2.3  PHOS  --  3.6   Liver Function Tests: No results for input(s): "AST", "ALT", "ALKPHOS", "BILITOT", "PROT", "ALBUMIN" in the last 168 hours. No results for input(s): "LIPASE", "AMYLASE" in the last 168 hours. No results for input(s): "AMMONIA" in the last 168 hours. CBC: Recent Labs  Lab 06/22/23 1153 06/23/23 0602  WBC 7.2 5.5  HGB 13.5 13.3  HCT 40.2 38.0  MCV 88.9 86.2  PLT 268 274   Cardiac Enzymes: No results for input(s): "CKTOTAL", "CKMB", "CKMBINDEX", "TROPONINI" in the last 168 hours. BNP: Invalid input(s): "POCBNP" CBG: No results for input(s): "GLUCAP" in the last 168 hours. D-Dimer Recent Labs    06/22/23 1503  DDIMER 0.66*   Hgb A1c No results for input(s): "HGBA1C" in the last 72 hours. Lipid Profile No results for input(s): "CHOL", "HDL", "LDLCALC", "TRIG", "CHOLHDL", "LDLDIRECT" in the last 72 hours. Thyroid function studies No results for input(s): "TSH", "T4TOTAL", "T3FREE", "THYROIDAB" in the last 72 hours.  Invalid input(s): "FREET3" Anemia work up No results for input(s): "VITAMINB12", "FOLATE", "FERRITIN", "TIBC", "IRON", "RETICCTPCT" in the last 72 hours. Urinalysis    Component Value  Date/Time   COLORURINE STRAW (A) 06/23/2023 1439   APPEARANCEUR CLEAR (A) 06/23/2023 1439   LABSPEC 1.040 (H) 06/23/2023 1439   PHURINE 7.0 06/23/2023 1439   GLUCOSEU NEGATIVE 06/23/2023 1439   HGBUR NEGATIVE 06/23/2023 1439   BILIRUBINUR NEGATIVE 06/23/2023 1439   KETONESUR NEGATIVE 06/23/2023 1439   PROTEINUR NEGATIVE 06/23/2023 1439   NITRITE NEGATIVE 06/23/2023 1439   LEUKOCYTESUR NEGATIVE 06/23/2023 1439   Sepsis Labs Recent Labs  Lab 06/22/23 1153 06/23/23 0602  WBC 7.2 5.5   Microbiology No results found for this or any previous visit (from the past 240 hour(s)). Imaging CT ANGIO HEAD NECK W WO CM  Result Date: 06/23/2023 CLINICAL DATA:  Stroke/TIA. Determine embolic source. Progressive weakness and shortness of breath following a recent drive to [place]. EXAM: CT ANGIOGRAPHY HEAD AND NECK WITH AND WITHOUT CONTRAST TECHNIQUE: Multidetector CT imaging of the head and neck was performed using the standard protocol during bolus administration of intravenous contrast. Multiplanar CT image reconstructions and MIPs were obtained to evaluate the vascular anatomy. Carotid stenosis measurements (when applicable) are obtained utilizing NASCET criteria, using the distal internal carotid diameter as the denominator. RADIATION DOSE REDUCTION: This exam was performed according to the departmental dose-optimization program which includes automated exposure control, adjustment of the mA and/or kV according to patient size and/or use of iterative reconstruction technique. CONTRAST:  75mL OMNIPAQUE IOHEXOL 350 MG/ML SOLN COMPARISON:  MR head without contrast 06/22/2023. CT head without contrast 06/22/2023. FINDINGS: CT HEAD FINDINGS Brain: No acute infarct, hemorrhage, or mass lesion is present. The ventricles are of normal size. No significant extraaxial fluid collection is present. Deep brain nuclei are within normal limits. No significant white matter lesions are present. The brainstem and  cerebellum are within normal limits. Vascular: No hyperdense vessel or unexpected calcification. Skull: Calvarium is intact. No focal lytic or blastic lesions are present. No significant extracranial soft tissue lesion is present. Sinuses/Orbits: The paranasal sinuses and mastoid air cells are clear. The globes and orbits are within normal limits. Review of the MIP images confirms the above findings CTA NECK FINDINGS Aortic arch: Atherosclerotic calcifications are present  in the distal aortic arch without aneurysm or stenosis. Great vessel origins are within limits. Right carotid system: Right common carotid arteries are within normal limits bilaterally. Right cervical bifurcation is normal. The cervical right ICA is normal. Left carotid system: The left common carotid artery is within normal limits. The left bifurcation is unremarkable. The cervical left ICA is normal. Vertebral arteries: The right vertebral artery is the dominant vessel. Both vertebral arteries originate from the subclavian arteries without significant stenosis. No significant stenosis is present in either vertebral artery in neck. Skeleton: Vertebral body heights and alignment are normal. Straightening of the normal cervical lordosis may be positional. No focal osseous lesions are present. Other neck: Soft tissues the neck are otherwise unremarkable. Salivary glands are within normal limits. Thyroid is normal. No significant adenopathy is present. No focal mucosal or submucosal lesions are present. Upper chest: The lung apices are clear. The thoracic inlet is within normal limits. Review of the MIP images confirms the above findings CTA HEAD FINDINGS Anterior circulation: The internal carotid arteries are within normal limits from high cervical segments cracks that the internal carotid arteries are within normal limits from the skull base to the ICA termini. The A1 and M1 segments are normal. Anterior communicating artery is patent. MCA  bifurcations are normal. The ACA and MCA branch vessels are normal bilaterally. No aneurysm is present. Posterior circulation: The right vertebral artery is dominant. PICA origins are visualized and normal. The vertebrobasilar junction basilar artery normal. Both posterior cerebral are scratched at the superior cerebellar arteries are patent. The right posterior cerebral artery originates from basilar tip. The left posterior cerebral artery is of fetal type. A small left P1 segment is present. The PCA branch vessels are normal bilaterally. Venous sinuses: The dural sinuses are patent. The straight sinus and deep cerebral veins are intact. Cortical veins are within normal limits. No significant vascular malformation is evident. Anatomic variants: Fetal type left posterior cerebral artery. Review of the MIP images confirms the above findings IMPRESSION: 1. Normal noncontrast CT of the head. 2. Normal CTA of the neck. 3. Normal CTA Circle of Willis without significant proximal stenosis, aneurysm, or branch vessel occlusion. 4.  Aortic Atherosclerosis (ICD10-I70.0). Electronically Signed   By: Marin Roberts M.D.   On: 06/23/2023 12:44   CT Angio Chest Pulmonary Embolism (PE) W or WO Contrast  Result Date: 06/22/2023 CLINICAL DATA:  Pulmonary embolism (PE) suspected, low to intermediate prob, positive D-dimer. Syncope. EXAM: CT ANGIOGRAPHY CHEST WITH CONTRAST TECHNIQUE: Multidetector CT imaging of the chest was performed using the standard protocol during bolus administration of intravenous contrast. Multiplanar CT image reconstructions and MIPs were obtained to evaluate the vascular anatomy. RADIATION DOSE REDUCTION: This exam was performed according to the departmental dose-optimization program which includes automated exposure control, adjustment of the mA and/or kV according to patient size and/or use of iterative reconstruction technique. CONTRAST:  60mL OMNIPAQUE IOHEXOL 350 MG/ML SOLN COMPARISON:  None  Available. FINDINGS: Cardiovascular: No filling defects in the pulmonary arteries to suggest pulmonary emboli. Heart is normal size. Aorta is normal caliber. Scattered aortic calcifications. Mediastinum/Nodes: No mediastinal, hilar, or axillary adenopathy. Trachea and esophagus are unremarkable. Thyroid unremarkable. Lungs/Pleura: Biapical scarring. No confluent opacities or effusions. Upper Abdomen: No acute findings Musculoskeletal: Chest wall soft tissues are unremarkable. No acute bony abnormality. Review of the MIP images confirms the above findings. IMPRESSION: No evidence of pulmonary embolus. Biapical scarring. No acute cardiopulmonary disease. Aortic Atherosclerosis (ICD10-I70.0). Electronically Signed   By: Caryn Bee  Dover M.D.   On: 06/22/2023 19:34   MR BRAIN WO CONTRAST  Result Date: 06/22/2023 CLINICAL DATA:  Acute onset weakness and nausea, blurry vision, stroke suspected EXAM: MRI HEAD WITHOUT CONTRAST TECHNIQUE: Multiplanar, multiecho pulse sequences of the brain and surrounding structures were obtained without intravenous contrast. COMPARISON:  06/22/2023 CT head, no prior MRI head available FINDINGS: Brain: No restricted diffusion to suggest acute or subacute infarct. No acute hemorrhage, mass, mass effect, or midline shift. No hydrocephalus or extra-axial collection. Pituitary and craniocervical junction within normal limits. No hemosiderin deposition to suggest remote hemorrhage. Scattered T2 hyperintense signal in the periventricular white matter, likely the sequela of mild chronic small vessel ischemic disease. Vascular: Normal arterial flow voids. Skull and upper cervical spine: Normal marrow signal. Sinuses/Orbits: Clear paranasal sinuses. No acute finding in the orbits. Other: The mastoid air cells are well aerated. IMPRESSION: No acute intracranial process. No evidence of acute or subacute infarct. Electronically Signed   By: Wiliam Ke M.D.   On: 06/22/2023 19:06   DG Chest 2  View  Result Date: 06/22/2023 CLINICAL DATA:  Syncope, diaphoresis and nausea. EXAM: CHEST - 2 VIEW COMPARISON:  06/28/2021 FINDINGS: The heart size and mediastinal contours are within normal limits. Stable scarring at both lung apices. There is no evidence of pulmonary edema, consolidation, pneumothorax, nodule or pleural fluid. The visualized skeletal structures are unremarkable. IMPRESSION: No active cardiopulmonary disease. Stable scarring at both lung apices. Electronically Signed   By: Irish Lack M.D.   On: 06/22/2023 16:07   CT HEAD WO CONTRAST ( )  Result Date: 06/22/2023 CLINICAL DATA:  Syncope/presyncope, cerebrovascular cause suspected EXAM: CT HEAD WITHOUT CONTRAST TECHNIQUE: Contiguous axial images were obtained from the base of the skull through the vertex without intravenous contrast. RADIATION DOSE REDUCTION: This exam was performed according to the departmental dose-optimization program which includes automated exposure control, adjustment of the mA and/or kV according to patient size and/or use of iterative reconstruction technique. COMPARISON:  None Available. FINDINGS: Brain: No evidence of acute infarction, hemorrhage, hydrocephalus, extra-axial collection or mass lesion/mass effect. Vascular: No hyperdense vessel Skull: Normal. Negative for fracture or focal lesion. Sinuses/Orbits: No acute finding. IMPRESSION: No evidence of acute intracranial abnormality. Electronically Signed   By: Feliberto Harts M.D.   On: 06/22/2023 15:35      Time coordinating discharge: over  30 minutes  SIGNED:  Sunnie Nielsen DO [facility]

## 2023-06-26 ENCOUNTER — Ambulatory Visit
Admission: RE | Admit: 2023-06-26 | Discharge: 2023-06-26 | Disposition: A | Discharge status: Home / Self Care | Payer: Medicare HMO | Source: Ambulatory Visit | Attending: Family | Admitting: Family

## 2023-06-26 ENCOUNTER — Telehealth: Payer: Self-pay

## 2023-06-26 ENCOUNTER — Ambulatory Visit: Payer: Medicare HMO | Admitting: Family

## 2023-06-26 ENCOUNTER — Encounter: Payer: Self-pay | Admitting: Family

## 2023-06-26 VITALS — BP 120/82 | HR 60 | Temp 98.6°F | Ht 64.0 in | Wt 145.0 lb

## 2023-06-26 DIAGNOSIS — J020 Streptococcal pharyngitis: Secondary | ICD-10-CM | POA: Diagnosis not present

## 2023-06-26 DIAGNOSIS — Z780 Asymptomatic menopausal state: Secondary | ICD-10-CM | POA: Insufficient documentation

## 2023-06-26 DIAGNOSIS — I700 Atherosclerosis of aorta: Secondary | ICD-10-CM | POA: Diagnosis not present

## 2023-06-26 DIAGNOSIS — Z1231 Encounter for screening mammogram for malignant neoplasm of breast: Secondary | ICD-10-CM | POA: Insufficient documentation

## 2023-06-26 DIAGNOSIS — R768 Other specified abnormal immunological findings in serum: Secondary | ICD-10-CM

## 2023-06-26 DIAGNOSIS — R42 Dizziness and giddiness: Secondary | ICD-10-CM | POA: Diagnosis not present

## 2023-06-26 DIAGNOSIS — Z87898 Personal history of other specified conditions: Secondary | ICD-10-CM

## 2023-06-26 DIAGNOSIS — M8589 Other specified disorders of bone density and structure, multiple sites: Secondary | ICD-10-CM | POA: Diagnosis not present

## 2023-06-26 MED ORDER — CEPHALEXIN 500 MG PO CAPS: 500.0000 mg | ORAL_CAPSULE | Freq: Two times a day (BID) | ORAL | 0 refills | Status: AC

## 2023-06-26 NOTE — Telephone Encounter (Signed)
Patient returned a call back from her two  missed TOC calls. She would like a return call back.

## 2023-06-26 NOTE — Progress Notes (Unsigned)
Established Patient Office Visit  Subjective:      CC:  Chief Complaint  Patient presents with   Hospitalization Follow-up    Still very weak     HPI: Dawn Matthews is a 67 y.o. female presenting on 06/26/2023 for Hospitalization Follow-up (Still very weak ) . Here for hospital f/u  Went to [facility] 11/15 went in for concerns of near syncopal episode. Prior to this she had sudden onset diaphoresis and felt body weakness and then lowered herself to ground, did not hit head. EKG NSR  Admitted 11/15 discharged 11/16,  CT head wo contrast: negative findings.   CTA neck , atherosclerotic calcifications in distal aortic arch , carotid arteries normal  CXR: no acute findings, stable scarring both lung apices.  CTA angio chest, no evidence of PE.  Biapical scarring no acute findings. Aortic atherosclerosis.  MR brain wo contrast: no acute intracranial process, scattered T2 hyperintense signal periventricular white matter.   Does see cardiology for pulmonary hypertension and tricuspid valve insufficiency, is on lasix 20 mg once daily and potassium 10 meq daily as well, prn afternoon if needed for increased sob, last visit 10/04/22. Echo history early 2023 tricuspid valve regurg. Bubble study positive, pfo suspected on echo per note. Repeat echo was recommended as well so they can see post lasix initiation.   New complaints: She tells me today that after she remembered the syncopal episode after discharge, she states she remembers she had been about to call 911, then couldn't see anything, then does believe she had blacked out.  No known h/o of syncope.  She does state that she had the episode on the couch, felt she had to go to the bathroom, got to the bathroom and had two bowel movements right after felt very clammy and then went down onto the floor and this is when she was looking at her phone and likely blacked out.   She doesn't have a f/u appt until January with cardiologist, but  is going to call back to get rescheduled. ER did recommend that she get a heart monitor as well and was told to f/u with cardiology in regards to this.   Seen by neurology in the past 01/09/22 with c/o right leg paresthesia where she felt as though the leg 'was not there' with some numbness. Had ordered EEG and MRI brain however MR had insurance issues and pt was unable to pursue. However now with MRI results, can make f/u with neurology. She has had a repeat episode but not very frequent and fairly recently, unsure exact time.   She feels a bit more short of breath today than usual, has not taken an additional lasix today. She states she feels a bit 'off' but AO x 3 Does have new progressive glasses that at times will also make her feel off, trying to get them adjusted as she goes.   She does report prior to this episode a few days prior had gone on a clean up for the env't    Social history:  Relevant past medical, surgical, family and social history reviewed and updated as indicated. Interim medical history since our last visit reviewed.  Allergies and medications reviewed and updated.  DATA REVIEWED: CHART IN EPIC     ROS: Negative unless specifically indicated above in HPI.    Current Outpatient Medications:    albuterol (VENTOLIN HFA) 108 (90 Base) MCG/ACT inhaler, Inhale 2 puffs into the lungs every 4 (four) hours as needed for  shortness of breath., Disp: 1 each, Rfl: 5   cephALEXin (KEFLEX) 500 MG capsule, Take 1 capsule (500 mg total) by mouth 2 (two) times daily for 10 days., Disp: 20 capsule, Rfl: 0   furosemide (LASIX) 20 MG tablet, Take 1 tablet (20 mg total) by mouth daily. With extra as needed after lunch, Disp: 145 tablet, Rfl: 3   Multiple Vitamins-Minerals (CENTRUM SILVER PO), Take 1 tablet by mouth daily at 6 (six) AM., Disp: , Rfl:    potassium chloride (KLOR-CON) 10 MEQ tablet, Take 1 tablet (10 mEq total) by mouth daily. With extra as needed, Disp: 145 tablet, Rfl:  3   VITAMIN D, CHOLECALCIFEROL, PO, Take 1 tablet by mouth daily at 6 (six) AM., Disp: , Rfl:       Objective:    BP 120/82   Pulse 60   Temp 98.6 F (37 C) (Oral)   Ht 5\' 4"  (1.626 m)   Wt 145 lb (65.8 kg)   SpO2 96%   BMI 24.89 kg/m   Wt Readings from Last 3 Encounters:  06/28/23 142 lb 12.8 oz (64.8 kg)  06/26/23 145 lb (65.8 kg)  06/22/23 145 lb (65.8 kg)    Physical Exam Vitals reviewed.  Constitutional:      General: She is not in acute distress.    Appearance: Normal appearance. She is normal weight. She is not ill-appearing, toxic-appearing or diaphoretic.  HENT:     Head: Normocephalic.     Right Ear: Tympanic membrane normal.     Left Ear: Tympanic membrane normal.     Nose: Nose normal.     Mouth/Throat:     Mouth: Mucous membranes are dry.     Pharynx: Posterior oropharyngeal erythema and postnasal drip present. No oropharyngeal exudate.  Eyes:     Extraocular Movements: Extraocular movements intact.     Pupils: Pupils are equal, round, and reactive to light.  Cardiovascular:     Rate and Rhythm: Normal rate and regular rhythm.     Pulses: Normal pulses.     Heart sounds: Normal heart sounds.  Pulmonary:     Effort: Pulmonary effort is normal.     Breath sounds: Normal breath sounds.  Musculoskeletal:        General: Normal range of motion.     Cervical back: Normal range of motion.     Right lower leg: No edema.     Left lower leg: No edema.  Neurological:     General: No focal deficit present.     Mental Status: She is alert and oriented to person, place, and time. Mental status is at baseline.     Cranial Nerves: Cranial nerves 2-12 are intact. No cranial nerve deficit or facial asymmetry.     Motor: Motor function is intact.     Coordination: Coordination is intact.     Gait: Gait is intact.  Psychiatric:        Mood and Affect: Mood normal.        Behavior: Behavior normal.        Thought Content: Thought content normal.        Judgment:  Judgment normal.           Assessment & Plan:  Aortic atherosclerosis (HCC)  History of syncope Assessment & Plan: Recent history last episode of syncope.  Rviewed hospital visit, dishcarge report, and neurology note.  Advised pt to f/u with cardiology for possible heart monitor and echocardiogram  Call to schedule f/u with  neurology as well for syncopal episode  Orders: -     Vitamin B12 -     TSH  Positive ANA (antinuclear antibody) -     ANA w/Reflex  Dizziness Assessment & Plan: Rise slowly upon standing Focus on water intake hydration goal of 90 oz daily  Orders: -     TSH  Strep pharyngitis Assessment & Plan: Explains feeling 'poorly'  Strep tested positive in office.  rx for cephalexin 500 mg bid x 10 days  Ibuprofen/tyelnol prn sore throat/fever Pt told to F/u if no improvement in the next 2-3 days.   Orders: -     Cephalexin; Take 1 capsule (500 mg total) by mouth 2 (two) times daily for 10 days.  Dispense: 20 capsule; Refill: 0     Return in about 3 months (around 09/26/2023) for follow up syncopal episode.  Mort Sawyers, MSN, APRN, FNP-C [facility]

## 2023-06-26 NOTE — Transitions of Care (Post Inpatient/ED Visit) (Signed)
   06/26/2023  Name: Dawn Matthews MRN: [id number] DOB: 07-29-1956  Today's TOC FU Call Status: Today's TOC FU Call Status:: Unsuccessful Call (1st Attempt) Unsuccessful Call (1st Attempt) Date: 06/26/23  Attempted to reach the patient regarding the most recent Inpatient/ED visit.  Follow Up Plan: Additional outreach attempts will be made to reach the patient to complete the Transitions of Care (Post Inpatient/ED visit) call.   Signature  .cbs

## 2023-06-26 NOTE — Patient Instructions (Addendum)
  Call neurology to schedule follow up appointment with the results of your MRI.  [address], [place], [place] [address]  < 1 mi [phone]  Call to schedule sooner follow up with cardiologist for possible echocardiogram and heart monitor.    Regards,   Mort Sawyers FNP-C

## 2023-06-27 LAB — TSH: TSH: 1.65 u[IU]/mL (ref 0.35–5.50)

## 2023-06-27 LAB — ANA W/REFLEX: Anti Nuclear Antibody (ANA): NEGATIVE

## 2023-06-27 LAB — VITAMIN B12: Vitamin B-12: 871 pg/mL (ref 211–911)

## 2023-06-28 ENCOUNTER — Ambulatory Visit: Payer: Medicare HMO | Attending: Medical | Admitting: Medical

## 2023-06-28 ENCOUNTER — Encounter: Payer: Self-pay | Admitting: Medical

## 2023-06-28 ENCOUNTER — Ambulatory Visit: Payer: Medicare HMO

## 2023-06-28 VITALS — BP 111/76 | HR 59 | Ht 63.0 in | Wt 142.8 lb

## 2023-06-28 DIAGNOSIS — Q2112 Patent foramen ovale: Secondary | ICD-10-CM | POA: Diagnosis not present

## 2023-06-28 DIAGNOSIS — I272 Pulmonary hypertension, unspecified: Secondary | ICD-10-CM

## 2023-06-28 DIAGNOSIS — I071 Rheumatic tricuspid insufficiency: Secondary | ICD-10-CM | POA: Diagnosis not present

## 2023-06-28 DIAGNOSIS — R55 Syncope and collapse: Secondary | ICD-10-CM

## 2023-06-28 DIAGNOSIS — R42 Dizziness and giddiness: Secondary | ICD-10-CM | POA: Insufficient documentation

## 2023-06-28 DIAGNOSIS — J020 Streptococcal pharyngitis: Secondary | ICD-10-CM | POA: Insufficient documentation

## 2023-06-28 NOTE — Patient Instructions (Addendum)
Medication Instructions:  Your Physician recommend you continue on your current medication as directed.    *If you need a refill on your cardiac medications before your next appointment, please call your pharmacy*   Lab Work: None ordered at this time  If you have labs (blood work) drawn today and your tests are completely normal, you will receive your results only by: MyChart Message (if you have MyChart) OR A paper copy in the mail If you have any lab test that is abnormal or we need to change your treatment, we will call you to review the results.   Testing/Procedures: Your physician has requested that you have an echocardiogram with BUBBLES. Echocardiography is a painless test that uses sound waves to create images of your heart. It provides your doctor with information about the size and shape of your heart and how well your heart's chambers and valves are working.   You may receive an ultrasound enhancing agent through an IV if needed to better visualize your heart during the echo. This procedure takes approximately one hour.  There are no restrictions for this procedure.  This will take place at [address] [facility] [address], [place] [address]  Please note: We ask at that you not bring children with you during ultrasound (echo/ vascular) testing. Due to room size and safety concerns, children are not allowed in the ultrasound rooms during exams. Our front office staff cannot provide observation of children in our lobby area while testing is being conducted. An adult accompanying a patient to their appointment will only be allowed in the ultrasound room at the discretion of the ultrasound technician under special circumstances. We apologize for any inconvenience.   ZIO AT Long term monitor-Live Telemetry  Your physician has requested you wear a ZIO patch monitor for 14 days.  This is a single patch monitor. [employer] supplies one patch monitor per enrollment.  Additional  stickers are not available.  Please do not apply patch if you will be having a Nuclear Stress Test, Echocardiogram, Cardiac CT, MRI,  or Chest Xray during the period you would be wearing the monitor. The patch cannot be worn during  these tests. You cannot remove and re-apply the ZIO AT patch monitor.  Your ZIO patch monitor will be mailed 3 day USPS to your address on file. It may take 3-5 days to  receive your monitor after you have been enrolled.  Once you have received your monitor, please review the enclosed instructions. Your monitor has  already been registered assigning a specific monitor serial # to you.   Billing and Patient Assistance Program information  Meredeth Ide has been supplied with any insurance information on record for billing. [employer] offers a sliding scale Patient Assistance Program for patients without insurance, or whose  insurance does not completely cover the cost of the ZIO patch monitor. You must apply for the  Patient Assistance Program to qualify for the discounted rate. To apply, call [employer] at [phone],  select option 4, select option 2 , ask to apply for the Patient Assistance Program, (you can request an  interpreter if needed). [employer] will ask your household income and how many people are in your  household. [employer] will quote your out-of-pocket cost based on this information. They will also be able  to set up a 12 month interest free payment plan if needed.  Applying the monitor   Shave hair from upper left chest.  Hold the abrader disc by orange tab. Rub  the abrader in 40 strokes over left upper chest as indicated in  your monitor instructions.  Clean area with 4 enclosed alcohol pads. Use all pads to ensure the area is cleaned thoroughly. Let  dry.  Apply patch as indicated in monitor instructions. Patch will be placed under collarbone on left side of  chest with arrow pointing upward.  Rub patch adhesive wings for 2 minutes. Remove  the white label marked "1". Remove the white label  marked "2". Rub patch adhesive wings for 2 additional minutes.  While looking in a mirror, press and release button in center of patch. A small green light will flash 3-4  times. This will be your only indicator that the monitor has been turned on.  Do not shower for the first 24 hours. You may shower after the first 24 hours.  Press the button if you feel a symptom. You will hear a small click. Record Date, Time and Symptom in  the Patient Log.   Starting the Gateway  In your kit there is a Audiological scientist box the size of a cellphone. This is Buyer, retail. It transmits all your  recorded data to [facility]. This box must always stay within 10 feet of you. Open the box and push the *  button. There will be a light that blinks orange and then green a few times. When the light stops  blinking, the Gateway is connected to the ZIO patch. Call [employer] at [phone] to confirm your monitor is transmitting.  Returning your monitor  Remove your patch and place it inside the Gateway. In the lower half of the Gateway there is a white  bag with prepaid postage on it. Place Gateway in bag and seal. Mail package back to [place] as soon as  possible. Your physician should have your final report approximately 7 days after you have mailed back  your monitor. Call [facility] Customer Care at [phone] if you have questions regarding your ZIO AT  patch monitor. Call them immediately if you see an orange light blinking on your monitor.  If your monitor falls off in less than 4 days, contact our Monitor department at [phone]. If your  monitor becomes loose or falls off after 4 days call [employer] at [phone] for suggestions on  securing your monitor    Follow-Up: At [facility], you and your health needs are our priority.  As part of our continuing mission to provide you with exceptional heart care, we have created  designated Provider Care Teams.  These Care Teams include your primary Cardiologist (physician) and Advanced Practice Providers (APPs -  Physician Assistants and Nurse Practitioners) who all work together to provide you with the care you need, when you need it.  We recommend signing up for the patient portal called "MyChart".  Sign up information is provided on this After Visit Summary.  MyChart is used to connect with patients for Virtual Visits (Telemedicine).  Patients are able to view lab/test results, encounter notes, upcoming appointments, etc.  Non-urgent messages can be sent to your provider as well.   To learn more about what you can do with MyChart, go to [email or web address].    Your next appointment:   2 month(s)  Provider:   You may see Dossie Arbour, MD or one of the following Advanced Practice Providers on your designated Care Team:   Cadence Seneca, [place]

## 2023-06-28 NOTE — Assessment & Plan Note (Addendum)
Explains feeling 'poorly'  Strep tested positive in office.  rx for cephalexin 500 mg bid x 10 days  Ibuprofen/tyelnol prn sore throat/fever Pt told to F/u if no improvement in the next 2-3 days.

## 2023-06-28 NOTE — Progress Notes (Signed)
Cardiology Office Note:    Date:  06/28/2023   ID:  Dawn Matthews, DOB 24-Mar-1956, MRN [id number]  PCP:  Mort Sawyers, FNP  [facility] Cardiologist:  None  [facility] Electrophysiologist:  None   Referring MD: Mort Sawyers, FNP   Chief Complaint: Hospital follow-up  History of Present Illness:    Dawn Matthews is a 67 y.o. female with a hx of anxiety, osteopenia, history of squamous cell carcinoma of the scalp, asthma, pulmonary HTN, and PFO who presents for follow-up.  Echocardiogram in February 2023 showed normal LV function, normal RV function, moderately elevated right heart pressures, mod to severe TR, cannot exclude small PFO, positive bubble study with small shunting observed within 3-6 cardiac cycles suggestive of interatrial shunt.   The patient was last seen 09/2022 and was stable from a cardiac perspective. She was on lasix and potassium.   The patient was admitted to [facility] in November 2020 for for syncope. It was 4 AM and she got up to use the bathroom.  She was in the kitchen and she felt foggy head, lightheaded, dizzy, diaphoresis and felt weak. She sat on the cough and had 2 BM's. She felt like she was going to vomit, but didn't. She was in the bathroom when she blacked out. Woke up and felt shaky and then fell asleep. Later that day she called the doctor and went to the ER.  Vitals were stable.  Labs are unremarkable.  EKG showed normal sinus rhythm without ischemic changes.  D-dimer was elevated, cardiac CTA showed no PE, aortic calcifications.  MRI of the brain showed no acute process.  CTA head and neck were normal.  Patient was sent home with follow-up instructions.  Since being home she feels shaky. She saw her PCP who diagnosed the patient with Strep. She denies chest pain. She feels breathing is short. She feels tired and weak. She feels occasional heart racing. No more fainting. She has been eating and drinking normally. She walks daily, but  recently got a bike. She felt very SOB riding uphill. She is also playing pickle ball.   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   cephALEXin (KEFLEX) 500 MG capsule Take 1 capsule (500 mg total) by mouth 2 (two) times daily for 10 days.   furosemide (LASIX) 20 MG tablet Take 1 tablet (20 mg total) by mouth daily. With extra as needed after lunch   Multiple Vitamins-Minerals (CENTRUM SILVER PO) Take 1 tablet by mouth daily at 6 (six) AM.   potassium chloride (KLOR-CON) 10 MEQ tablet Take 1 tablet (10 mEq total) by mouth daily. With extra as needed   VITAMIN D, CHOLECALCIFEROL, PO Take 1 tablet by mouth daily at 6 (six) AM.  Allergies:   Penicillins and Codeine   Social History   Socioeconomic History   Marital status: Divorced    Spouse name: Not on file   Number of children: 0   Years of education: College   Highest education level: Not on file  Occupational History   Not on file  Tobacco Use   Smoking status: Never   Smokeless tobacco: Never  Vaping Use   Vaping status: Never Used  Substance and Sexual Activity   Alcohol use: Not Currently    Alcohol/week: 0.0 - 3.0 standard drinks of alcohol   Drug use: Never   Sexual activity: Not Currently  Other Topics Concern   Not on file  Social History Narrative   12/10/19   From: [place], [place] - moved to [place] in 2019 to be near  family   Living: alone    Pet: dog - german shepherd   Work: retired from [employer] and [employer]      Family: sister nearby, but does have family in [place] still      Enjoys: walking, visiting small towns, crossword puzzles, drink coffee      Exercise: walking 3-4 miles per day   Diet: likes ice cream eater, trying to eat healthier now, usually skips lunch and eats early dinner      Safety   Seat belts: Yes    Guns: No   Safe in relationships: Yes    Social Determinants of Health   Financial Resource Strain: Low Risk  (04/07/2022)   Overall Financial Resource Strain (CARDIA)    Difficulty of Paying Living Expenses: Not hard at all  Food Insecurity: No Food Insecurity (04/07/2022)   Hunger Vital Sign    Worried About Running Out of Food in the Last Year: Never true    Ran Out of Food in the Last Year: Never true  Transportation Needs: No Transportation Needs (04/07/2022)   PRAPARE - Administrator, Civil Service (Medical): No    Lack of Transportation (Non-Medical): No  Physical Activity: Sufficiently Active (04/07/2022)   Exercise Vital Sign    Days of Exercise per Week: 7 days    Minutes of Exercise per Session: 30 min  Stress: No Stress Concern Present (04/07/2022)   [employer] of Occupational Health - Occupational Stress Questionnaire    Feeling of Stress : Not at all  Social Connections: Not on file     Family History: The patient's family history includes Atrial fibrillation in her mother; COPD in her mother; Diabetes in her father, maternal grandmother, and mother; Hypertension in her mother and sister; Lung cancer in her sister; Melanoma in her brother and sister. There is no history of Breast cancer, Colon polyps, Colon cancer, Esophageal cancer, Rectal cancer, or Stomach cancer.  ROS:   Please see the history of present illness.     All other systems reviewed and are negative.  EKGs/Labs/Other Studies Reviewed:    The following studies were  reviewed today:  Echo 09/2021 1. Left ventricular ejection fraction, by estimation, is 60 to 65%. The  left ventricle has normal function. The left ventricle has no regional  wall motion abnormalities. Left ventricular diastolic parameters are  indeterminate. The average left  ventricular global longitudinal strain is -19.2 %.   2. Right ventricular systolic function is normal. The right ventricular  size is normal. There is moderately elevated pulmonary artery systolic  pressure. The estimated right ventricular systolic pressure is 48.6 mmHg.  3. The mitral valve is normal in structure. Mild mitral valve  regurgitation. No evidence of mitral stenosis.   4. Tricuspid valve regurgitation is moderate to severe.   5. The aortic valve was not well visualized. Aortic valve regurgitation  is not visualized. No aortic stenosis is present.   6. The inferior vena cava is dilated in size with <50% respiratory  variability, suggesting right atrial pressure of 15 mmHg.   7. Cannot exclude a small PFO. Agitated saline contrast bubble study was  positive with small shunting observed within 3-6 cardiac cycles suggestive  of interatrial shunt.   EKG:  EKG is not ordered today.    Recent Labs: 04/12/2023: ALT 21 06/22/2023: B Natriuretic Peptide 53.0 06/23/2023: BUN 13; Creatinine, Ser 0.72; Hemoglobin 13.3; Magnesium 2.3; Platelets 274; Potassium 3.9; Sodium 138 06/26/2023: TSH 1.65  Recent Lipid Panel    Component Value Date/Time   CHOL 171 04/12/2023 1155   TRIG 62.0 04/12/2023 1155   HDL 73.20 04/12/2023 1155   CHOLHDL 2 04/12/2023 1155   VLDL 12.4 04/12/2023 1155   LDLCALC 85 04/12/2023 1155    Physical Exam:    VS:  BP 111/76 (BP Location: Left Arm, Patient Position: Sitting, Cuff Size: Normal)   Pulse (!) 59   Ht 5\' 3"  (1.6 m)   Wt 142 lb 12.8 oz (64.8 kg)   SpO2 96%   BMI 25.30 kg/m     Wt Readings from Last 3 Encounters:  06/28/23 142 lb 12.8 oz (64.8 kg)  06/26/23 145 lb  (65.8 kg)  06/22/23 145 lb (65.8 kg)     GEN:  Well nourished, well developed in no acute distress HEENT: Normal NECK: No JVD; No carotid bruits LYMPHATICS: No lymphadenopathy CARDIAC: RRR, no murmurs, rubs, gallops RESPIRATORY:  Clear to auscultation without rales, wheezing or rhonchi  ABDOMEN: Soft, non-tender, non-distended MUSCULOSKELETAL:  No edema; No deformity  SKIN: Warm and dry NEUROLOGIC:  Alert and oriented x 3 PSYCHIATRIC:  Normal affect   ASSESSMENT:    1. Syncope, unspecified syncope type   2. Pulmonary hypertension, unspecified (HCC)   3. Tricuspid valve insufficiency, unspecified etiology   4. PFO (patent foramen ovale)    PLAN:    In order of problems listed above:  Syncope Patient reports syncopal episode that sounds more vasovagal, however cannot exclude cardiac etiology. Work-up in the hospital was unremarkable. Chest CT showed scattered aortic calcifications. She denies any further syncope or pre-syncope. She was recently diagnosed with Strep throat and is on abx. Orthostatics negative today. I will order an Echo bubble study and 2 week live Heart monitor.   PFO TR Pulmonary HTN Echo in 2023 showed LVEF 60-65%, normal RVSF, mild MR, mod to severe TR, small PFO with positive bubble study with small shunting. She has been on lasix 20mg  daily with potassium supplement.   Disposition: Follow up in 2 month(s) with MD/APP   Signed, Ayako Tapanes David Stall, PA-C  06/28/2023 9:00 AM     [facility]

## 2023-06-28 NOTE — Assessment & Plan Note (Signed)
Rise slowly upon standing Focus on water intake hydration goal of 90 oz daily

## 2023-06-28 NOTE — Assessment & Plan Note (Signed)
Recent history last episode of syncope.  Rviewed hospital visit, dishcarge report, and neurology note.  Advised pt to f/u with cardiology for possible heart monitor and echocardiogram  Call to schedule f/u with neurology as well for syncopal episode

## 2023-06-29 ENCOUNTER — Encounter: Payer: Self-pay | Admitting: Physician Assistant

## 2023-06-29 ENCOUNTER — Ambulatory Visit: Payer: Medicare HMO | Admitting: Physician Assistant

## 2023-06-29 VITALS — BP 116/72 | HR 63 | Resp 20 | Ht 63.5 in

## 2023-06-29 DIAGNOSIS — R413 Other amnesia: Secondary | ICD-10-CM

## 2023-06-29 NOTE — Patient Instructions (Signed)
It was a pleasure to see you today at our office.   Follow up as needed   Whom to call:  Memory  decline, memory medications: Call our office [phone]   For psychiatric meds, mood meds: Please have your primary care physician manage these medications.   Counseling regarding caregiver distress, including caregiver depression, anxiety and issues regarding community resources, adult day care programs, adult living facilities, or memory care questions:   Feel free to contact Misty Lisabeth Register, Social Worker at [phone]   For assessment of decision of mental capacity and competency:  Call Dr. Erick Blinks, geriatric psychiatrist at [phone]  For guidance in geriatric dementia issues please call [facility] [phone]   If you have any severe symptoms of a stroke, or other severe issues such as confusion,severe chills or fever, etc call 911 or go to the ER as you may need to be evaluated further    RECOMMENDATIONS FOR ALL PATIENTS WITH MEMORY PROBLEMS: 1. Continue to exercise (Recommend 30 minutes of walking everyday, or 3 hours every week) 2. Increase social interactions - continue going to [place] and enjoy social gatherings with friends and family 3. Eat healthy, avoid fried foods and eat more fruits and vegetables 4. Maintain adequate blood pressure, blood sugar, and blood cholesterol level. Reducing the risk of stroke and cardiovascular disease also helps promoting better memory. 5. Avoid stressful situations. Live a simple life and avoid aggravations. Organize your time and prepare for the next day in anticipation. 6. Sleep well, avoid any interruptions of sleep and avoid any distractions in the bedroom that may interfere with adequate sleep quality 7. Avoid sugar, avoid sweets as there is a strong link between excessive sugar intake, diabetes, and cognitive impairment We discussed the Mediterranean diet, which has been shown to help patients reduce the  risk of progressive memory disorders and reduces cardiovascular risk. This includes eating fish, eat fruits and green leafy vegetables, nuts like almonds and hazelnuts, walnuts, and also use olive oil. Avoid fast foods and fried foods as much as possible. Avoid sweets and sugar as sugar use has been linked to worsening of memory function.  There is always a concern of gradual progression of memory problems. If this is the case, then we may need to adjust level of care according to patient needs. Support, both to the patient and caregiver, should then be put into place.      FALL PRECAUTIONS: Be cautious when walking. Scan the area for obstacles that may increase the risk of trips and falls. When getting up in the mornings, sit up at the edge of the bed for a few minutes before getting out of bed. Consider elevating the bed at the head end to avoid drop of blood pressure when getting up. Walk always in a well-lit room (use night lights in the walls). Avoid area rugs or power cords from appliances in the middle of the walkways. Use a walker or a cane if necessary and consider physical therapy for balance exercise. Get your eyesight checked regularly.  FINANCIAL OVERSIGHT: Supervision, especially oversight when making financial decisions or transactions is also recommended.  HOME SAFETY: Consider the safety of the kitchen when operating appliances like stoves, microwave oven, and blender. Consider having supervision and share cooking responsibilities until no longer able to participate in those. Accidents with firearms and other hazards in the house should be identified and addressed as well.   ABILITY TO BE LEFT ALONE: If patient is unable to contact  911 operator, consider using LifeLine, or when the need is there, arrange for someone to stay with patients. Smoking is a fire hazard, consider supervision or cessation. Risk of wandering should be assessed by caregiver and if detected at any point,  supervision and safe proof recommendations should be instituted.  MEDICATION SUPERVISION: Inability to self-administer medication needs to be constantly addressed. Implement a mechanism to ensure safe administration of the medications.   DRIVING: Regarding driving, in patients with progressive memory problems, driving will be impaired. We advise to have someone else do the driving if trouble finding directions or if minor accidents are reported. Independent driving assessment is available to determine safety of driving.   If you are interested in the driving assessment, you can contact the following:  [facility] [employer] [facility] in [place] [phone]  [facility] [phone]  [facility] [phone] or [phone]    Mediterranean Diet A Mediterranean diet refers to food and lifestyle choices that are based on the traditions of countries located on the [employer]. This way of eating has been shown to help prevent certain conditions and improve outcomes for people who have chronic diseases, like kidney disease and heart disease. What are tips for following this plan? Lifestyle  Cook and eat meals together with your family, when possible. Drink enough fluid to keep your urine clear or pale yellow. Be physically active every day. This includes: Aerobic exercise like running or swimming. Leisure activities like gardening, walking, or housework. Get 7-8 hours of sleep each night. If recommended by your health care provider, drink red wine in moderation. This means 1 glass a day for nonpregnant women and 2 glasses a day for men. A glass of wine equals 5 oz (150 mL). Reading food labels  Check the serving size of packaged foods. For foods such as rice and pasta, the serving size refers to the amount of cooked product, not dry. Check the total fat in packaged foods. Avoid foods that have saturated fat or trans  fats. Check the ingredients list for added sugars, such as corn syrup. Shopping  At the grocery store, buy most of your food from the areas near the walls of the store. This includes: Fresh fruits and vegetables (produce). Grains, beans, nuts, and seeds. Some of these may be available in unpackaged forms or large amounts (in bulk). Fresh seafood. Poultry and eggs. Low-fat dairy products. Buy whole ingredients instead of prepackaged foods. Buy fresh fruits and vegetables in-season from local farmers markets. Buy frozen fruits and vegetables in resealable bags. If you do not have access to quality fresh seafood, buy precooked frozen shrimp or canned fish, such as tuna, salmon, or sardines. Buy small amounts of raw or cooked vegetables, salads, or olives from the deli or salad bar at your store. Stock your pantry so you always have certain foods on hand, such as olive oil, canned tuna, canned tomatoes, rice, pasta, and beans. Cooking  Cook foods with extra-virgin olive oil instead of using butter or other vegetable oils. Have meat as a side dish, and have vegetables or grains as your main dish. This means having meat in small portions or adding small amounts of meat to foods like pasta or stew. Use beans or vegetables instead of meat in common dishes like chili or lasagna. Experiment with different cooking methods. Try roasting or broiling vegetables instead of steaming or sauteing them. Add frozen vegetables to soups, stews, pasta, or rice. Add nuts or seeds for  added healthy fat at each meal. You can add these to yogurt, salads, or vegetable dishes. Marinate fish or vegetables using olive oil, lemon juice, garlic, and fresh herbs. Meal planning  Plan to eat 1 vegetarian meal one day each week. Try to work up to 2 vegetarian meals, if possible. Eat seafood 2 or more times a week. Have healthy snacks readily available, such as: Vegetable sticks with hummus. Greek yogurt. Fruit and nut  trail mix. Eat balanced meals throughout the week. This includes: Fruit: 2-3 servings a day Vegetables: 4-5 servings a day Low-fat dairy: 2 servings a day Fish, poultry, or lean meat: 1 serving a day Beans and legumes: 2 or more servings a week Nuts and seeds: 1-2 servings a day Whole grains: 6-8 servings a day Extra-virgin olive oil: 3-4 servings a day Limit red meat and sweets to only a few servings a month What are my food choices? Mediterranean diet Recommended Grains: Whole-grain pasta. Brown rice. Bulgar wheat. Polenta. Couscous. Whole-wheat bread. Orpah Cobb. Vegetables: Artichokes. Beets. Broccoli. Cabbage. Carrots. Eggplant. Green beans. Chard. Kale. Spinach. Onions. Leeks. Peas. Squash. Tomatoes. Peppers. Radishes. Fruits: Apples. Apricots. Avocado. Berries. Bananas. Cherries. Dates. Figs. Grapes. Lemons. Melon. Oranges. Peaches. Plums. Pomegranate. Meats and other protein foods: Beans. Almonds. Sunflower seeds. Pine nuts. Peanuts. Cod. Salmon. Scallops. Shrimp. Tuna. Tilapia. Clams. Oysters. Eggs. Dairy: Low-fat milk. Cheese. Greek yogurt. Beverages: Water. Red wine. Herbal tea. Fats and oils: Extra virgin olive oil. Avocado oil. Grape seed oil. Sweets and desserts: [place] yogurt with honey. Baked apples. Poached pears. Trail mix. Seasoning and other foods: Basil. Cilantro. Coriander. Cumin. Mint. Parsley. Sage. Rosemary. Tarragon. Garlic. Oregano. Thyme. Pepper. Balsalmic vinegar. Tahini. Hummus. Tomato sauce. Olives. Mushrooms. Limit these Grains: Prepackaged pasta or rice dishes. Prepackaged cereal with added sugar. Vegetables: Deep fried potatoes (french fries). Fruits: Fruit canned in syrup. Meats and other protein foods: Beef. Pork. Lamb. Poultry with skin. Hot dogs. Tomasa Blase. Dairy: Ice cream. Sour cream. Whole milk. Beverages: Juice. Sugar-sweetened soft drinks. Beer. Liquor and spirits. Fats and oils: Butter. Canola oil. Vegetable oil. Beef fat (tallow).  Lard. Sweets and desserts: Cookies. Cakes. Pies. Candy. Seasoning and other foods: Mayonnaise. Premade sauces and marinades. The items listed may not be a complete list. Talk with your dietitian about what dietary choices are right for you. Summary The Mediterranean diet includes both food and lifestyle choices. Eat a variety of fresh fruits and vegetables, beans, nuts, seeds, and whole grains. Limit the amount of red meat and sweets that you eat. Talk with your health care provider about whether it is safe for you to drink red wine in moderation. This means 1 glass a day for nonpregnant women and 2 glasses a day for men. A glass of wine equals 5 oz (150 mL). This information is not intended to replace advice given to you by your health care provider. Make sure you discuss any questions you have with your health care provider. Document Released: 03/16/2016 Document Revised: 04/18/2016 Document Reviewed: 03/16/2016 Elsevier Interactive Patient Education  2017 [employer].    We have sent a referral to [facility] for your MRI and they will call you directly to schedule your appointment. They are located at [address] [facility]. If you need to contact them directly please call [phone].

## 2023-06-29 NOTE — Progress Notes (Signed)
Assessment/Plan:   Memory difficulties  Dawn Matthews is a very pleasant 67 y.o. 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  She has not been seen since 01/09/2022 who is here to follow-up.  Her last MoCA on 01/09/2022 was 29/30, today at 30/30.  There was no indication for antidementia medication time as memory is stable.     Recommendations:   Follow up as needed Recommend good control of cardiovascular risk factors, follow-up with cardiology Continue to control mood as per PCP    Subjective:   This patient is here alone. Previous records as well as any outside records available were reviewed prior to todays visit.      Any changes in memory since last visit? " Not very good", remembers conversations, some words are not able to be retrieved . Not as good with crossword puzzles  repeats oneself?  Endorsed Disoriented when walking into a room?  Patient denies    Misplacing objects?  Patient denies   Wandering behavior?   denies   Any personality changes since last visit? Denies, better since the divorce   Any worsening depression?: denies.. Hallucinations or paranoia?  Denies.   Seizures?   denies    Any sleep changes?  Does not sleep very well . Sometimes she may have vivid dreams, REM behavior or sleepwalking   Sleep apnea?   denies .   Any hygiene concerns?   denies   Independent of bathing and dressing?  Endorsed  Does the patient needs help with medications? Patient is in charge   Who is in charge of the finances?  Patient is in charge      Any changes in appetite?  Denies.   Patient have trouble swallowing?  denies   Does the patient cook?  Yes, does not  forget common recipes . Any kitchen accidents such as leaving the stove on?   Denies.   Any headaches?    Denies.   Vision changes? Denies. Uses progressive glasses.  Chronic pain?  Denies.   Ambulates with difficulty?    Denies.    Recent falls or head injuries?   Denies.      Unilateral weakness, numbness or tingling?   Denies.   Any tremors?  denies   Any anosmia?  I never had a sense of smell    Any incontinence of urine?  Denies.   Any bowel dysfunction?  Denies.      Patient lives alone Does the patient drive? Drives without any issues    Initial visit 01/09/2022 How long did patient have memory difficulties? For several years, between 5-7 years, especially with simple words."I am catholic, when I take my walks I say the [facility], but 6 months ago couldn't remember it, so I got worried . I like to watch movies and if someone asks about the protagonist, I cannot come up immediately with the name except " is the person that begins with a B".  She likes to do crossword puzzles and reading, and watch game shows as Jeopardy.  Patient lives with:   Patient lives alone, she is in the process of divorce repeats oneself?  "If it is a story, I might, not a whole lot" Disoriented when  walking into a room?  Patient denies   Leaving objects in unusual places?  Patient denies   Ambulates  with difficulty? "At times I stagger to the R, not quite often"   She walks 3 or 4 miles a day and does swimming. She is to join the [employer] soon.  Recent falls?  Patient denies   Any head injuries?  Patient denies   History of seizures?   Patient denies   Wandering behavior?  Patient denies   Patient drives?   Getting worse at directions . Patient uses GPS to drive   Any mood changes ? " When I was younger I was moodier. Not a whole lot goes on in my life to cause agitation".  Any history of depression?:  Patient denies   Hallucinations?  Patient denies   Paranoia?  Patient denies "I am worry wort"  "Nervous Nelly"  -as I am getting older"  Patient reports that he sleeps poorly," I have a system go to sleep 1-2-3 , then get up a pot of coffee at 2 am, turn the TV on and fall asleep again . Denies vivid dreams, REM behavior or sleepwalking .    History of sleep apnea?  Patient denies   Any hygiene concerns?  Patient denies   Independent of bathing and dressing?  Endorsed  Does the patient needs help with medications?   Patient denies   Who is in charge of the finances?  Patient is in charge   Any changes in appetite?  Endorsed.  She is trying to lose weight and trying to eat healthier, she skips lunch but she eats early dinner. Patient have trouble swallowing? Patient denies   Does the patient cook?  Patient denies   Any kitchen accidents such as leaving the stove on? Patient denies   Any headaches?  Patient denies   Double vision? Endorsed. " I blame it on my glasses, all of a sudden if doing different things I have to see because I have double vision. Have to sit and weigh it out"  Happened every few months, over the last 2 years. Due for eye checkup   Any focal numbness or tingling?  About 1 month ago, while walking, the patient felt "as if the leg was missing, can't describe the feeling ", felt like her leg was going to give out or not work.  Happened twice in May, did not notice a foot drop, did not feel lifting her knee, she denies any pain.  This lasted for a minute at each episode, it was very "strange feeling ", no swelling, does not know if she felt numbness.  Denies any back pain.  Did not feel any electrical shock with that  event. However, she had one episode while lying in bed, 6-7 month ago when she felt  both  arms and legs  having an electrical shock without recurrence. Chronic back pain Patient denies   Unilateral weakness?  Patient denies   Any tremors?  Patient "never had a good sense of smell"  Any history of anosmia?  Patient denies   Any incontinence of urine?  Patient denies  Any bowel  dysfunction?   Patient denies .  She had a recent colonoscopy on 09/19/2021 showing multiple polyps, followed by GI History of heavy alcohol intake?  Patient denies   History of heavy tobacco use?  Patient denies   Caffeine?  She drinks coffee 2 cups a day . Family history of dementia?  Patient denies .  Questionable dementia in Mother (died at 9)  Single.  College education.  No children.  Originally from [place], [place], moved to in 2019 to be near her family.  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   cephALEXin (KEFLEX) 500 MG capsule Take 1 capsule (500 mg total) by mouth 2 (two) times daily for 10 days.   furosemide (LASIX) 20 MG tablet Take 1 tablet (20 mg total) by mouth daily. With extra as needed after lunch   Multiple Vitamins-Minerals (CENTRUM SILVER PO) Take 1 tablet by mouth daily at 6 (six) AM.   potassium chloride (KLOR-CON) 10 MEQ tablet Take 1 tablet (10 mEq total) by mouth daily. With extra as needed   VITAMIN D, CHOLECALCIFEROL, PO Take 1 tablet by mouth daily at 6 (six) AM.   No facility-administered encounter medications on file as  of 06/29/2023.     Objective:     PHYSICAL EXAMINATION:    VITALS:   Vitals:   06/29/23 1237  BP: 116/72  Pulse: 63  Resp: 20  SpO2: 96%  Height: 5' 3.5" (1.613 m)    GEN:  The patient appears stated age and is in NAD. HEENT:  Normocephalic, atraumatic.   Neurological examination:  General: NAD, well-groomed, appears stated age. Orientation: The patient is alert. Oriented to person, place and date Cranial nerves: There is good facial symmetry.The speech is fluent and clear. No aphasia or dysarthria. Fund of knowledge is appropriate. Recent  and remote memory is normal.  Attention and concentration are normal.  Able to name objects and repeat phrases.  Hearing is intact to conversational tone.   Delayed recall 5/5  Sensation: Sensation is intact to light touch throughout Motor: Strength is at least antigravity x4. DTR's 2/4 in UE/LE      07/02/2023    5:00 PM 01/09/2022    9:00 AM  Montreal Cognitive Assessment   Visuospatial/ Executive (0/5) 5 5  Naming (0/3) 3 3  Attention: Read list of digits (0/2) 2 2  Attention: Read list of letters (0/1) 1 1  Attention: Serial 7 subtraction starting at 100 (0/3) 3 3  Language: Repeat phrase (0/2) 2 2  Language : Fluency (0/1) 1 1  Abstraction (0/2) 2 1  Delayed Recall (0/5) 5 5  Orientation (0/6) 6 6  Total 30 29  Adjusted Score (based on education) 30 29        No data to display  Movement examination: Tone: There is normal tone in the UE/LE Abnormal movements:  no tremor.  No myoclonus.  No asterixis.   Coordination:  There is no decremation with RAM's. Normal finger to nose  Gait and Station: The patient has no difficulty arising out of a deep-seated chair without the use of the hands. The patient's stride length is good.  Gait is cautious and narrow.   Thank you for allowing [place] the opportunity to participate in the care of this nice patient. Please do not hesitate to contact us for any questions or  concerns.   Total time spent on today's visit was 38 minutes dedicated to this patient today, preparing to see patient, examining the patient, ordering tests and/or medications and counseling the patient, documenting clinical information in the EHR or other health record, independently interpreting results and communicating results to the patient/family, discussing treatment and goals, answering patient's questions and coordinating care.  Cc:  Mort Sawyers, FNP  Marlowe Kays 07/02/2023 5:39 PM

## 2023-07-02 DIAGNOSIS — R55 Syncope and collapse: Secondary | ICD-10-CM

## 2023-07-03 DIAGNOSIS — R55 Syncope and collapse: Secondary | ICD-10-CM | POA: Diagnosis not present

## 2023-07-03 NOTE — Addendum Note (Signed)
Addended by: Marlowe Kays E on: 07/03/2023 05:27 PM   Modules accepted: Level of Service

## 2023-07-12 DIAGNOSIS — R69 Illness, unspecified: Secondary | ICD-10-CM | POA: Diagnosis not present

## 2023-07-15 ENCOUNTER — Other Ambulatory Visit: Payer: Self-pay | Admitting: Family

## 2023-07-15 DIAGNOSIS — G479 Sleep disorder, unspecified: Secondary | ICD-10-CM

## 2023-07-19 ENCOUNTER — Ambulatory Visit: Payer: Medicare HMO | Attending: Medical

## 2023-07-19 DIAGNOSIS — Q2112 Patent foramen ovale: Secondary | ICD-10-CM | POA: Diagnosis not present

## 2023-07-19 DIAGNOSIS — R55 Syncope and collapse: Secondary | ICD-10-CM | POA: Diagnosis not present

## 2023-07-19 LAB — ECHOCARDIOGRAM COMPLETE
AR max vel: 1.86 cm2
AV Area VTI: 1.84 cm2
AV Area mean vel: 1.76 cm2
AV Mean grad: 3 mm[Hg]
AV Peak grad: 5.8 mm[Hg]
Ao pk vel: 1.2 m/s
Area-P 1/2: 3.53 cm2
Calc EF: 50.7 %
S' Lateral: 2.9 cm
Single Plane A2C EF: 51 %
Single Plane A4C EF: 50.7 %

## 2023-08-13 ENCOUNTER — Telehealth: Payer: Self-pay

## 2023-08-13 DIAGNOSIS — I472 Ventricular tachycardia, unspecified: Secondary | ICD-10-CM

## 2023-08-13 NOTE — Telephone Encounter (Signed)
 Pt made aware.  Orders placed and instructions sent to pt via mychart   Furth, Cadence H, PA-C to Me     08/12/23  8:12 AM Result Note Heart monitor showed normal rhythm  with 1 run of a fast heart rhythm from the bottom chambers lasting 20 beats. This may be benign or it may mean there are heart blockages. I think we should check a Cardiac CTA to be sure. It also showed fast heart rhythm from the stop chambers lasting 11.6 seconds. Overall reassuring no symptoms reported.

## 2023-08-15 ENCOUNTER — Telehealth (HOSPITAL_COMMUNITY): Payer: Self-pay | Admitting: *Deleted

## 2023-08-15 DIAGNOSIS — I472 Ventricular tachycardia, unspecified: Secondary | ICD-10-CM | POA: Diagnosis not present

## 2023-08-15 NOTE — Telephone Encounter (Signed)
Reaching out to patient to offer assistance regarding upcoming cardiac imaging study; pt verbalizes understanding of appt date/time, parking situation and where to check in, pre-test NPO status  and verified current allergies; name and call back number provided for further questions should they arise ? ?Adalaya Irion RN Navigator Cardiac Imaging ?[facility] ?[phone] office ?[phone] cell ? ?

## 2023-08-15 NOTE — Telephone Encounter (Signed)
 Pt called stating she typically take lasix in the evening and what to make sure it was ok to take tonight. Pt informed she is ok to take lasix tonight. Pt verbalized understanding.

## 2023-08-15 NOTE — Telephone Encounter (Signed)
 Patient came by office to get lab work Patient would like to review instructions for test again so please call to discuss Has questions regarding medication

## 2023-08-16 ENCOUNTER — Ambulatory Visit
Admission: RE | Admit: 2023-08-16 | Discharge: 2023-08-16 | Disposition: A | Discharge status: Home / Self Care | Payer: Medicare HMO | Source: Ambulatory Visit | Attending: Medical | Admitting: Medical

## 2023-08-16 DIAGNOSIS — Z8249 Family history of ischemic heart disease and other diseases of the circulatory system: Secondary | ICD-10-CM | POA: Diagnosis not present

## 2023-08-16 DIAGNOSIS — I4729 Other ventricular tachycardia: Secondary | ICD-10-CM | POA: Insufficient documentation

## 2023-08-16 DIAGNOSIS — R079 Chest pain, unspecified: Secondary | ICD-10-CM | POA: Insufficient documentation

## 2023-08-16 DIAGNOSIS — J020 Streptococcal pharyngitis: Secondary | ICD-10-CM | POA: Insufficient documentation

## 2023-08-16 DIAGNOSIS — I272 Pulmonary hypertension, unspecified: Secondary | ICD-10-CM | POA: Insufficient documentation

## 2023-08-16 DIAGNOSIS — I472 Ventricular tachycardia, unspecified: Secondary | ICD-10-CM | POA: Diagnosis present

## 2023-08-16 DIAGNOSIS — I700 Atherosclerosis of aorta: Secondary | ICD-10-CM | POA: Insufficient documentation

## 2023-08-16 DIAGNOSIS — F419 Anxiety disorder, unspecified: Secondary | ICD-10-CM | POA: Diagnosis not present

## 2023-08-16 LAB — BASIC METABOLIC PANEL
BUN/Creatinine Ratio: 20 (ref 12–28)
BUN: 15 mg/dL (ref 8–27)
CO2: 26 mmol/L (ref 20–29)
Calcium: 9.4 mg/dL (ref 8.7–10.3)
Chloride: 102 mmol/L (ref 96–106)
Creatinine, Ser: 0.74 mg/dL (ref 0.57–1.00)
Glucose: 85 mg/dL (ref 70–99)
Potassium: 4.5 mmol/L (ref 3.5–5.2)
Sodium: 141 mmol/L (ref 134–144)
eGFR: 89 mL/min/{1.73_m2} (ref >59)

## 2023-08-16 MED ORDER — DILTIAZEM HCL 25 MG/5ML IV SOLN: 10.0000 mg | INTRAVENOUS | Status: DC | PRN

## 2023-08-16 MED ORDER — SODIUM CHLORIDE 0.9 % IV BOLUS
150.0000 mL | Freq: Once | INTRAVENOUS | Status: AC
  Administered 2023-08-16: 150 mL via INTRAVENOUS

## 2023-08-16 MED ORDER — METOPROLOL TARTRATE 5 MG/5ML IV SOLN
10.0000 mg | INTRAVENOUS | Status: DC | PRN
  Administered 2023-08-16: 10 mg via INTRAVENOUS

## 2023-08-16 MED ORDER — NITROGLYCERIN 0.4 MG SL SUBL
0.8000 mg | SUBLINGUAL_TABLET | Freq: Once | SUBLINGUAL | Status: AC
Start: 2023-08-16 — End: 2023-08-16
  Administered 2023-08-16: 0.8 mg via SUBLINGUAL

## 2023-08-16 MED ORDER — IOHEXOL 350 MG/ML SOLN
75.0000 mL | Freq: Once | INTRAVENOUS | Status: AC | PRN
  Administered 2023-08-16: 75 mL via INTRAVENOUS

## 2023-08-16 NOTE — Progress Notes (Signed)
 Patient tolerated procedure well. Ambulate w/o difficulty. Denies light headedness or being dizzy. Sitting up drinking water provided. Encouraged to drink extra water today and reasoning explained. Verbalized understanding. All questions answered. ABC intact. No further needs. Discharge from procedure area w/o issues.

## 2023-08-28 ENCOUNTER — Ambulatory Visit: Payer: Medicare HMO | Admitting: Cardiovascular Disease

## 2023-08-31 NOTE — Progress Notes (Unsigned)
Cardiology Office Note  Date:  09/03/2023   ID:  Dawn Matthews, DOB 02-15-1956, MRN [id number]  PCP:  Mort Sawyers, FNP   Chief Complaint  Patient presents with   Follow up cardiac testing    Discuss Cardiac CT, Echo & Zio monitor. Patient c/o shortness of breath with occasional tachycardia spells.     HPI:  [name] is a 68 yo woman with PMH o   Chronic shortness of breath, asthma  followed by pulmonary, concern for COPD Non-smoker Who presents for follow-up of her abnormal ekg, PFO Moderate tricuspid valve regurgitation Who presents for syncope, Chest pain intermittently, shortness of breath, pulmonary HTN  Last seen by myself in clinic February 2024 Seen by one of our providers November 2024  11/24: sitting on bathroom floor 4 Am after 2 bowel movements, did not feel right, lightheaded Episode of syncope from sitting position Workup in the ER Concerning for vasovagal episode after bowel movements  Additional studies performed Echo December 2024 EF 60%, moderate TR, right heart pressures 30  Event monitor normal sinus rhythm, rare episodes of tachycardia longest 11 seconds Triggered events associated with normal sinus rhythm  Cardiac CTA Score 0, no significant coronary disease  Reports she is on Lasix daily with potassium daily Blood pressure running low today  Prior history of significant shortness of breath Eval waited by pulmonary, PFTs unrevealing Virus, "back to back" starting Nov 2022 Steroids x2, ABX  Echocardiogram February 2023 Normal LV function, normal RV function Moderately elevated right heart pressures,  tricuspid valve regurgitation At that time was started on Lasix daily   PMH:   has a past medical history of Anxiety, Basal cell carcinoma, Hyperglycemia, Osteopenia, and 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  PSH:    Past Surgical History:  Procedure Laterality Date   COLONOSCOPY  09/19/2021   [facility]   DILATION AND  CURETTAGE OF UTERUS     FOOT SURGERY  2010   POLYPECTOMY     SKIN CANCER EXCISION     basal cell   WISDOM TOOTH EXTRACTION      Current Outpatient Medications  Medication Sig Dispense Refill   albuterol (VENTOLIN HFA) 108 (90 Base) MCG/ACT inhaler Inhale 2 puffs into the lungs every 4 (four) hours as needed for shortness of breath. 1 each 5   Calcium Carbonate-Vit D-Min (CALCIUM 1200 PO) Take by mouth daily.     furosemide (LASIX) 20 MG tablet Take 1 tablet (20 mg total) by mouth daily. With extra as needed after lunch 145 tablet 3   Multiple Vitamins-Minerals (CENTRUM SILVER PO) Take 1 tablet by mouth daily at 6 (six) AM.     potassium chloride (KLOR-CON) 10 MEQ tablet Take 1 tablet (10 mEq total) by mouth daily. With extra as needed 145 tablet 3   traZODone (DESYREL) 50 MG tablet TAKE 0.5-1 TABLETS BY MOUTH AT BEDTIME AS NEEDED FOR SLEEP. 90 tablet 0   VITAMIN D, CHOLECALCIFEROL, PO Take 1 tablet by mouth daily at 6 (six) AM.     No current facility-administered medications for this visit.     Allergies:   Penicillins and Codeine   Social History:  The patient  reports that she has never smoked. She has never used smokeless tobacco. She reports that she does not currently use alcohol. She reports that she does not use drugs.   Family History:   family history includes Atrial fibrillation in her mother; COPD in her mother; Diabetes in her father, maternal grandmother,  and mother; Hypertension in her mother and sister; Lung cancer in her sister; Melanoma in her brother and sister.    Review of Systems: Review of Systems  Constitutional: Negative.   HENT: Negative.    Respiratory: Negative.    Cardiovascular: Negative.   Gastrointestinal: Negative.   Musculoskeletal: Negative.   Neurological: Negative.   Psychiatric/Behavioral: Negative.    All other systems reviewed and are negative.   PHYSICAL EXAM: VS:  BP 100/68 (BP Location: Left Arm, Patient Position: Sitting, Cuff  Size: Normal)   Pulse 64   Ht 5\' 4"  (1.626 m)   Wt 146 lb (66.2 kg)   SpO2 98%   BMI 25.06 kg/m  , BMI Body mass index is 25.06 kg/m. Constitutional:  oriented to person, place, and time. No distress.  HENT:  Head: Grossly normal Eyes:  no discharge. No scleral icterus.  Neck: No JVD, no carotid bruits  Cardiovascular: Regular rate and rhythm, no murmurs appreciated Pulmonary/Chest: Clear to auscultation bilaterally, no wheezes or rails Abdominal: Soft.  no distension.  no tenderness.  Musculoskeletal: Normal range of motion Neurological:  normal muscle tone. Coordination normal. No atrophy Skin: Skin warm and dry Psychiatric: normal affect, pleasant  Recent Labs: 04/12/2023: ALT 21 06/22/2023: B Natriuretic Peptide 53.0 06/23/2023: Hemoglobin 13.3; Magnesium 2.3; Platelets 274 06/26/2023: TSH 1.65 08/15/2023: BUN 15; Creatinine, Ser 0.74; Potassium 4.5; Sodium 141    Lipid Panel Lab Results  Component Value Date   CHOL 171 04/12/2023   HDL 73.20 04/12/2023   LDLCALC 85 04/12/2023   TRIG 62.0 04/12/2023      Wt Readings from Last 3 Encounters:  09/03/23 146 lb (66.2 kg)  06/28/23 142 lb 12.8 oz (64.8 kg)  06/26/23 145 lb (65.8 kg)     ASSESSMENT AND PLAN:  Problem List Items Addressed This Visit       Cardiology Problems   Tricuspid regurgitation     Other   Prediabetes   Other Visit Diagnoses       Syncope, unspecified syncope type    -  Primary     Chest pain of uncertain etiology         Shortness of breath           Shortness of breath/pulmonary hypertension Moderately elevated right heart pressures noted on echocardiogram in early 2023 with tricuspid valve regurgitation Recent echocardiogram with moderate TR Given recent syncope and low blood pressure on today's visit we recommend she cut back on her Lasix to 5 days a week from 7 days a week  PFO Likely small PFO noted on echocardiogram, bubble study positive Asymptomatic  Tricuspid valve  regurgitation Noted on echocardiogram in 2023 and again in 2024 High normal right heart pressures  Syncope Likely vasovagal following 2 bowel movements in the setting of hypovolemia and hypotension Workup negative, recommended she cut back on Lasix to 5 days a week from 7 days a week, monitor blood pressure closely at home especially orthostatics For lightheaded spells recommend she lay flat and check blood pressure   Signed, Dossie Arbour, M.D., Ph.D. [facility] [place], [place] [phone]

## 2023-09-03 ENCOUNTER — Encounter: Payer: Self-pay | Admitting: Cardiovascular Disease

## 2023-09-03 ENCOUNTER — Ambulatory Visit: Payer: Medicare HMO | Attending: Cardiovascular Disease | Admitting: Cardiovascular Disease

## 2023-09-03 VITALS — BP 100/68 | HR 64 | Ht 64.0 in | Wt 146.0 lb

## 2023-09-03 DIAGNOSIS — R079 Chest pain, unspecified: Secondary | ICD-10-CM

## 2023-09-03 DIAGNOSIS — R0602 Shortness of breath: Secondary | ICD-10-CM | POA: Diagnosis not present

## 2023-09-03 DIAGNOSIS — I071 Rheumatic tricuspid insufficiency: Secondary | ICD-10-CM

## 2023-09-03 DIAGNOSIS — R7303 Prediabetes: Secondary | ICD-10-CM

## 2023-09-03 DIAGNOSIS — R55 Syncope and collapse: Secondary | ICD-10-CM | POA: Diagnosis not present

## 2023-09-03 MED ORDER — FUROSEMIDE 20 MG PO TABS: ORAL_TABLET | ORAL | 3 refills | Status: DC

## 2023-09-03 MED ORDER — POTASSIUM CHLORIDE ER 10 MEQ PO TBCR: EXTENDED_RELEASE_TABLET | ORAL | 3 refills | Status: DC

## 2023-09-03 NOTE — Patient Instructions (Addendum)
Medication Instructions:  Please cut lasix and potassium back to 5 x a week  If you need a refill on your cardiac medications before your next appointment, please call your pharmacy.   Lab work: No new labs needed  Testing/Procedures: No new testing needed  Follow-Up: At [facility], you and your health needs are our priority.  As part of our continuing mission to provide you with exceptional heart care, we have created designated Provider Care Teams.  These Care Teams include your primary Cardiologist (physician) and Advanced Practice Providers (APPs -  Physician Assistants and Nurse Practitioners) who all work together to provide you with the care you need, when you need it.  You will need a follow up appointment in 12 months  Providers on your designated Care Team:   Nicolasa Ducking, NP Eula Listen, PA-C Cadence Fransico Michael, [place]  COVID-19 Vaccine Information can be found at: [email or web address] For questions related to vaccine distribution or appointments, please email [email or web address] or call [phone].

## 2023-09-04 DIAGNOSIS — L820 Inflamed seborrheic keratosis: Secondary | ICD-10-CM | POA: Diagnosis not present

## 2023-09-04 DIAGNOSIS — L538 Other specified erythematous conditions: Secondary | ICD-10-CM | POA: Diagnosis not present

## 2023-09-04 DIAGNOSIS — L570 Actinic keratosis: Secondary | ICD-10-CM | POA: Diagnosis not present

## 2023-09-04 DIAGNOSIS — L578 Other skin changes due to chronic exposure to nonionizing radiation: Secondary | ICD-10-CM | POA: Diagnosis not present

## 2023-09-04 DIAGNOSIS — Z85828 Personal history of other malignant neoplasm of skin: Secondary | ICD-10-CM | POA: Diagnosis not present

## 2023-09-04 DIAGNOSIS — X32XXXA Exposure to sunlight, initial encounter: Secondary | ICD-10-CM | POA: Diagnosis not present

## 2023-09-04 DIAGNOSIS — D2271 Melanocytic nevi of right lower limb, including hip: Secondary | ICD-10-CM | POA: Diagnosis not present

## 2023-09-04 DIAGNOSIS — D2261 Melanocytic nevi of right upper limb, including shoulder: Secondary | ICD-10-CM | POA: Diagnosis not present

## 2023-09-04 DIAGNOSIS — L821 Other seborrheic keratosis: Secondary | ICD-10-CM | POA: Diagnosis not present

## 2023-09-04 DIAGNOSIS — D2262 Melanocytic nevi of left upper limb, including shoulder: Secondary | ICD-10-CM | POA: Diagnosis not present

## 2023-09-04 DIAGNOSIS — D225 Melanocytic nevi of trunk: Secondary | ICD-10-CM | POA: Diagnosis not present

## 2023-09-04 DIAGNOSIS — D2272 Melanocytic nevi of left lower limb, including hip: Secondary | ICD-10-CM | POA: Diagnosis not present

## 2023-10-10 DIAGNOSIS — L570 Actinic keratosis: Secondary | ICD-10-CM | POA: Diagnosis not present

## 2023-10-14 ENCOUNTER — Other Ambulatory Visit: Payer: Self-pay | Admitting: Family

## 2023-10-14 DIAGNOSIS — G479 Sleep disorder, unspecified: Secondary | ICD-10-CM

## 2023-10-15 ENCOUNTER — Other Ambulatory Visit: Payer: Self-pay | Admitting: Cardiovascular Disease

## 2023-12-24 ENCOUNTER — Other Ambulatory Visit: Payer: Self-pay | Admitting: Cardiovascular Disease

## 2024-01-08 DIAGNOSIS — H2513 Age-related nuclear cataract, bilateral: Secondary | ICD-10-CM | POA: Diagnosis not present

## 2024-03-13 DIAGNOSIS — L570 Actinic keratosis: Secondary | ICD-10-CM | POA: Diagnosis not present

## 2024-03-13 DIAGNOSIS — D2262 Melanocytic nevi of left upper limb, including shoulder: Secondary | ICD-10-CM | POA: Diagnosis not present

## 2024-03-13 DIAGNOSIS — D2261 Melanocytic nevi of right upper limb, including shoulder: Secondary | ICD-10-CM | POA: Diagnosis not present

## 2024-03-13 DIAGNOSIS — D2272 Melanocytic nevi of left lower limb, including hip: Secondary | ICD-10-CM | POA: Diagnosis not present

## 2024-03-13 DIAGNOSIS — Z85828 Personal history of other malignant neoplasm of skin: Secondary | ICD-10-CM | POA: Diagnosis not present

## 2024-03-13 DIAGNOSIS — D225 Melanocytic nevi of trunk: Secondary | ICD-10-CM | POA: Diagnosis not present

## 2024-03-31 ENCOUNTER — Ambulatory Visit: Payer: Self-pay | Admitting: *Deleted

## 2024-03-31 NOTE — Telephone Encounter (Signed)
 NOTED

## 2024-03-31 NOTE — Telephone Encounter (Signed)
 FYI Only or Action Required?: FYI only for provider.  Dawn Matthews was last seen in primary care on 06/26/2023 by Corwin Antu, FNP.  Called Nurse Triage reporting Sore Throat.  Symptoms began several days ago.  Interventions attempted: Rest, hydration, or home remedies.  Symptoms are: gradually worsening.  Triage Disposition: See Physician Within 24 Hours  Dawn Matthews/caregiver understands and will follow disposition?: Yes                Copied from CRM #[id number]. Topic: Clinical - Red Word Triage >> Mar 31, 2024  8:36 AM Dawn Matthews wrote: Red Word that prompted transfer to Nurse Triage: Received call from Dawn Matthews, states she is not feeling well, having very bad sore thorat, feels like something in throat, choking sensation, lethargic, difficulty/heavy breathing, feels feverish, body warm all over. Reason for Disposition  Diabetes mellitus or weak immune system (e.g., HIV positive, cancer chemo, splenectomy, organ transplant, chronic steroids)  Answer Assessment - Initial Assessment Questions Appt scheduled tomorrow. Dawn Matthews denies heavy breathing , palpitations now. Chocking sensation from post nasal drip. Feverish feeling and chills at times none now.       1. ONSET: When did the throat start hurting? (Hours or days ago)      Saturday  2. SEVERITY: How bad is the sore throat? (Scale 1-10; mild, moderate or severe)     Moderate  3. STREP EXPOSURE: Has there been any exposure to strep within the past week? If Yes, ask: What type of contact occurred?      na 4.  VIRAL SYMPTOMS: Are there any symptoms of a cold, such as a runny nose, cough, hoarse voice or red eyes?      Nasal congestion /post nasal drip, heavy phlegm feeling, felt palpitations last night  5. FEVER: Do you have a fever? If Yes, ask: What is your temperature, how was it measured, and when did it start?     Not sure , chills  6. PUS ON THE TONSILS: Is there pus on the tonsils in the back of your  throat?     na 7. OTHER SYMPTOMS: Do you have any other symptoms? (e.g., difficulty breathing, headache, rash)     Headache, sore throat, post nasal drip. Heavy breathing at times none now , chills Saturday  8. PREGNANCY: Is there any chance you are pregnant? When was your last menstrual period?     na  Protocols used: Sore Throat-A-AH

## 2024-04-01 ENCOUNTER — Encounter: Payer: Self-pay | Admitting: Family

## 2024-04-01 ENCOUNTER — Ambulatory Visit (INDEPENDENT_AMBULATORY_CARE_PROVIDER_SITE_OTHER): Admitting: Family

## 2024-04-01 VITALS — BP 104/60 | HR 64 | Temp 98.3°F | Ht 63.0 in | Wt 143.2 lb

## 2024-04-01 DIAGNOSIS — J029 Acute pharyngitis, unspecified: Secondary | ICD-10-CM

## 2024-04-01 DIAGNOSIS — Z20822 Contact with and (suspected) exposure to covid-19: Secondary | ICD-10-CM

## 2024-04-01 DIAGNOSIS — R051 Acute cough: Secondary | ICD-10-CM

## 2024-04-01 LAB — POCT RAPID STREP A (OFFICE): Rapid Strep A Screen: NEGATIVE

## 2024-04-01 LAB — POC COVID19 BINAXNOW: SARS Coronavirus 2 Ag: NEGATIVE

## 2024-04-01 MED ORDER — BENZONATATE 200 MG PO CAPS: 200.0000 mg | ORAL_CAPSULE | Freq: Two times a day (BID) | ORAL | 0 refills | Status: AC | PRN

## 2024-04-01 NOTE — Progress Notes (Signed)
 g  Established Patient Office Visit  Subjective:      CC:  Chief Complaint  Patient presents with   Acute Visit    Reports sore throat, post nasal drip, cough and headache. Denies fever.    HPI: Dawn Matthews is a 68 y.o. female presenting on 04/01/2024 for Acute Visit (Reports sore throat, post nasal drip, cough and headache. Denies fever.) .  Discussed the use of AI scribe software for clinical note transcription with the patient, who gave verbal consent to proceed.  History of Present Illness Dawn Matthews is a 67 year old female who presents with upper respiratory symptoms following a recent road trip.  Symptoms began on Saturday morning with a severe headache, which is unusual for her, and persisted throughout the night. By Sunday, she noticed throat discomfort while driving home from a road trip. Her symptoms have progressed over four days.  She describes her throat as feeling 'a little full' and 'tender' but denies a sore throat. She can eat and drink without difficulty. She feels congested but denies significant sinus pressure. She has developed a productive cough today, along with a fleeting wheeze and some shortness of breath.  She has not measured her temperature but feels hot at times, though she typically does not register a fever. No ear pain reported.  For symptom relief, she uses over-the-counter cortisodent lozenges, which she finds helpful, and ginger ale for comfort. She has been sleeping well since becoming ill, although she generally does not sleep well. She has tried trazodone  in the past but is unsure of its effectiveness.         Social history:  Relevant past medical, surgical, family and social history reviewed and updated as indicated. Interim medical history since our last visit reviewed.  Allergies and medications reviewed and updated.  DATA REVIEWED: CHART IN EPIC     ROS: Negative unless specifically indicated above in HPI.     Current Outpatient Medications:    albuterol  (VENTOLIN  HFA) 108 (90 Base) MCG/ACT inhaler, Inhale 2 puffs into the lungs every 4 (four) hours as needed for shortness of breath., Disp: 1 each, Rfl: 5   benzonatate  (TESSALON ) 200 MG capsule, Take 1 capsule (200 mg total) by mouth 2 (two) times daily as needed for cough., Disp: 20 capsule, Rfl: 0   Calcium  Carbonate-Vit D-Min (CALCIUM  1200 PO), Take by mouth daily., Disp: , Rfl:    furosemide  (LASIX ) 20 MG tablet, TAKE 1 TABLET (20 MG TOTAL) BY MOUTH DAILY. WITH EXTRA AS NEEDED AFTER LUNCH, Disp: 145 tablet, Rfl: 3   Multiple Vitamins-Minerals (CENTRUM SILVER PO), Take 1 tablet by mouth daily at 6 (six) AM., Disp: , Rfl:    potassium chloride  (KLOR-CON ) 10 MEQ tablet, TAKE 1 TABLET (10 MEQ TOTAL) BY MOUTH DAILY. WITH EXTRA AS NEEDED, Disp: 145 tablet, Rfl: 3   VITAMIN D , CHOLECALCIFEROL, PO, Take 1 tablet by mouth daily at 6 (six) AM., Disp: , Rfl:    traZODone  (DESYREL ) 50 MG tablet, TAKE 0.5-1 TABLETS BY MOUTH AT BEDTIME AS NEEDED FOR SLEEP. (Patient not taking: Reported on 04/01/2024), Disp: 90 tablet, Rfl: 0        Objective:        BP 104/60 (BP Location: Left Arm, Patient Position: Sitting, Cuff Size: Normal)   Pulse 64   Temp 98.3 F (36.8 C) (Oral)   Ht 5' 3 (1.6 m)   Wt 143 lb 3.2 oz (65 kg)   SpO2 98%   BMI 25.37 kg/m  Physical Exam CHEST: Lungs clear to auscultation with a fleeting wheeze.  Wt Readings from Last 3 Encounters:  04/01/24 143 lb 3.2 oz (65 kg)  09/03/23 146 lb (66.2 kg)  06/28/23 142 lb 12.8 oz (64.8 kg)    Physical Exam Vitals reviewed.  Constitutional:      General: She is not in acute distress.    Appearance: Normal appearance. She is normal weight. She is not ill-appearing, toxic-appearing or diaphoretic.  HENT:     Head: Normocephalic.     Right Ear: Tympanic membrane normal.     Left Ear: Tympanic membrane normal.     Nose: Nose normal.     Mouth/Throat:     Mouth: Mucous membranes  are dry.     Pharynx: No oropharyngeal exudate or posterior oropharyngeal erythema.  Eyes:     Extraocular Movements: Extraocular movements intact.     Pupils: Pupils are equal, round, and reactive to light.  Cardiovascular:     Rate and Rhythm: Normal rate and regular rhythm.     Pulses: Normal pulses.     Heart sounds: Normal heart sounds.  Pulmonary:     Effort: Pulmonary effort is normal.     Breath sounds: Normal breath sounds.  Musculoskeletal:     Cervical back: Normal range of motion.  Neurological:     General: No focal deficit present.     Mental Status: She is alert and oriented to person, place, and time. Mental status is at baseline.  Psychiatric:        Mood and Affect: Mood normal.        Behavior: Behavior normal.        Thought Content: Thought content normal.        Judgment: Judgment normal.          Results LABS COVID-19 test: negative Streptococcal test: negative  Assessment & Plan:   Assessment and Plan Assessment & Plan Acute upper respiratory viral infection with acute cough Symptoms began on Saturday with a headache, progressing to a sore throat and cough by Sunday. COVID and strep tests are negative, indicating a viral etiology. No fever or ear pain reported. Mild congestion and productive cough present. Examination reveals fleeting wheeze, likely due to mucus, but no signs of bacterial infection. Lungs are clear, and no asthma history. Viral infection expected to worsen by day five and improve by day seven to ten. Avoidance of antibiotics is preferred unless symptoms significantly worsen or persist beyond the typical viral course. - Continue using lozenges as needed. - Prescribe Tessalon  Pearl capsules for cough if symptoms worsen. - Encourage increased fluid intake and consumption of chicken noodle soup. - Monitor symptoms and report if fever develops or shortness of breath worsens. - Reassess if symptoms persist or worsen by day six. -consider  zpack if no improvement day 7  Recording duration: 7 minutes      Return if symptoms worsen or fail to improve.     Ginger Patrick, MSN, APRN, FNP-C [facility]

## 2024-04-03 ENCOUNTER — Other Ambulatory Visit: Payer: Self-pay | Admitting: Family

## 2024-04-03 ENCOUNTER — Encounter: Payer: Self-pay | Admitting: Family

## 2024-04-03 ENCOUNTER — Ambulatory Visit: Payer: Self-pay

## 2024-04-03 DIAGNOSIS — J0110 Acute frontal sinusitis, unspecified: Secondary | ICD-10-CM

## 2024-04-03 MED ORDER — DOXYCYCLINE HYCLATE 100 MG PO TABS: 100.0000 mg | ORAL_TABLET | Freq: Two times a day (BID) | ORAL | 0 refills | Status: AC

## 2024-04-03 NOTE — Telephone Encounter (Signed)
 I am sending in doxycycline .  This is better if there are concerns for bronchitis, sinus and or lower respiratory infection. Zpack is really ongoing good for chest issues not sinus.

## 2024-04-03 NOTE — Telephone Encounter (Signed)
 Addressed in another note

## 2024-04-03 NOTE — Telephone Encounter (Signed)
 Pt seen by you on 8/26. Ok to send meds or do you want her to be seen in office?

## 2024-04-03 NOTE — Telephone Encounter (Signed)
 Copied from CRM #[id number]. Topic: Clinical - Medication Question >> Apr 03, 2024 12:32 PM Dedra B wrote: Reason for CRM: Pt called to to follow up on previous msg about having a z-pak called in. Pls call pt.

## 2024-04-03 NOTE — Telephone Encounter (Signed)
 FYI Only or Action Required?: Action required by provider: update on patient condition.  Patient was last seen in primary care on 04/01/2024 by Corwin Antu, FNP.  Called Nurse Triage reporting Sinusitis.  Symptoms began several days ago.  Interventions attempted: Nothing.  Symptoms are: gradually improving.  Triage Disposition: See Physician Within 24 Hours  Patient/caregiver understands and will follow disposition?: UnsureCopied from CRM #[id number]. Topic: Clinical - Red Word Triage >> Apr 03, 2024  8:09 AM [place] MATSU wrote: Red Word that prompted transfer to Nurse Triage: burning up in fever on and off, labored breathing, still sick with worse symptoms, congestion and head is pounding Reason for Disposition  Earache  Answer Assessment - Initial Assessment Questions My breathing is ok.I do feel better than yesterday. Pt sound very congested over phone. Pt has not tried OTC pain medication. No appt with PCP today. Pt is asking if Zpack can be called in. Pt sent message on mychart updating PCP. Please advise     1. LOCATION: Where does it hurt?      Forehead/eyes 2. ONSET: When did the sinus pain start?  (e.g., hours, days)      Several days ago 3. SEVERITY: How bad is the pain?   (Scale 0-10; or none, mild, moderate or severe)     7 4. RECURRENT SYMPTOM: Have you ever had sinus problems before? If Yes, ask: When was the last time? and What happened that time?      Yes last year 5. NASAL CONGESTION: Is the nose blocked? If Yes, ask: Can you open it or must you breathe through your mouth?     yes 6. NASAL DISCHARGE: Do you have discharge from your nose? If so ask, What color?     yes 7. FEVER: Do you have a fever? If Yes, ask: What is it, how was it measured, and when did it start?      Feels  8. OTHER SYMPTOMS: Do you have any other symptoms? (e.g., sore throat, cough, earache, difficulty breathing)     Headache, earache-both clogged  Protocols  used: Sinus Pain or Congestion-A-AH

## 2024-04-04 NOTE — Telephone Encounter (Signed)
 Spoke with pt and she is aware of Tabitha's message.

## 2024-04-08 ENCOUNTER — Ambulatory Visit: Admitting: Family

## 2024-05-15 DIAGNOSIS — L570 Actinic keratosis: Secondary | ICD-10-CM | POA: Diagnosis not present

## 2024-05-15 DIAGNOSIS — L820 Inflamed seborrheic keratosis: Secondary | ICD-10-CM | POA: Diagnosis not present

## 2024-05-15 DIAGNOSIS — L538 Other specified erythematous conditions: Secondary | ICD-10-CM | POA: Diagnosis not present

## 2024-05-15 DIAGNOSIS — R202 Paresthesia of skin: Secondary | ICD-10-CM | POA: Diagnosis not present

## 2024-08-12 ENCOUNTER — Other Ambulatory Visit: Payer: Self-pay | Admitting: Family

## 2024-08-12 DIAGNOSIS — Z1231 Encounter for screening mammogram for malignant neoplasm of breast: Secondary | ICD-10-CM

## 2024-08-13 ENCOUNTER — Ambulatory Visit
Admission: RE | Admit: 2024-08-13 | Discharge: 2024-08-13 | Disposition: A | Discharge status: Home / Self Care | Source: Ambulatory Visit | Attending: Family | Admitting: Family

## 2024-08-13 DIAGNOSIS — Z1231 Encounter for screening mammogram for malignant neoplasm of breast: Secondary | ICD-10-CM | POA: Insufficient documentation

## 2024-08-18 ENCOUNTER — Ambulatory Visit: Payer: Self-pay | Admitting: Family

## 2024-10-30 ENCOUNTER — Ambulatory Visit

## 2024-10-30 ENCOUNTER — Encounter: Admitting: Family
